# Patient Record
Sex: Female | Born: 1990 | Race: White | Hispanic: No | Marital: Married | Smoking: Current some day smoker
Health system: Southern US, Community
[De-identification: ages and names within clinical notes are randomized; demographics above are authoritative.]

## PROBLEM LIST (undated history)

## (undated) ENCOUNTER — Inpatient Hospital Stay (HOSPITAL_COMMUNITY): Payer: Self-pay

## (undated) DIAGNOSIS — H409 Unspecified glaucoma: Secondary | ICD-10-CM

## (undated) DIAGNOSIS — D649 Anemia, unspecified: Secondary | ICD-10-CM

## (undated) DIAGNOSIS — M1990 Unspecified osteoarthritis, unspecified site: Secondary | ICD-10-CM

## (undated) DIAGNOSIS — G43909 Migraine, unspecified, not intractable, without status migrainosus: Secondary | ICD-10-CM

## (undated) DIAGNOSIS — F99 Mental disorder, not otherwise specified: Secondary | ICD-10-CM

## (undated) DIAGNOSIS — Z3490 Encounter for supervision of normal pregnancy, unspecified, unspecified trimester: Secondary | ICD-10-CM

## (undated) HISTORY — PX: OTHER SURGICAL HISTORY: SHX169

## (undated) HISTORY — PX: WISDOM TOOTH EXTRACTION: SHX21

## (undated) HISTORY — PX: DILATION AND CURETTAGE OF UTERUS: SHX78

---
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
## 2008-04-02 ENCOUNTER — Other Ambulatory Visit: Admission: RE | Admit: 2008-04-02 | Discharge: 2008-04-02 | Payer: Self-pay | Admitting: Obstetrics and Gynecology

## 2008-09-05 ENCOUNTER — Ambulatory Visit: Payer: Self-pay | Admitting: Obstetrics and Gynecology

## 2008-09-05 ENCOUNTER — Inpatient Hospital Stay (HOSPITAL_COMMUNITY): Admission: AD | Admit: 2008-09-05 | Discharge: 2008-09-08 | Payer: Self-pay | Admitting: Obstetrics and Gynecology

## 2008-09-08 ENCOUNTER — Ambulatory Visit: Payer: Self-pay | Admitting: Advanced Practice Midwife

## 2008-09-08 ENCOUNTER — Inpatient Hospital Stay (HOSPITAL_COMMUNITY): Admission: AD | Admit: 2008-09-08 | Discharge: 2008-09-08 | Payer: Self-pay | Admitting: Obstetrics & Gynecology

## 2009-06-19 ENCOUNTER — Emergency Department (HOSPITAL_COMMUNITY): Admission: EM | Admit: 2009-06-19 | Discharge: 2009-06-20 | Payer: Self-pay | Admitting: Emergency Medicine

## 2009-12-31 ENCOUNTER — Emergency Department (HOSPITAL_COMMUNITY): Admission: EM | Admit: 2009-12-31 | Discharge: 2010-01-01 | Payer: Self-pay | Admitting: Emergency Medicine

## 2010-07-20 NOTE — L&D Delivery Note (Signed)
Delivery Note At 8:19 PM a viable female was delivered via Vaginal, Spontaneous Delivery (Presentation: ; Occiput Anterior).  APGAR: 8, 9; weight 10 lb 3.7 oz (4641 g).   Placenta status: Intact, Spontaneous.  Cord: 3 vessels with the following complications: None.    Anesthesia: Epidural  Episiotomy: None Lacerations: None Est. Blood Loss (mL): 300 mL  Mom to postpartum.  Baby to nursery-stable.  [name] [name] 07/19/2011, 8:42 PM

## 2010-09-10 ENCOUNTER — Other Ambulatory Visit: Payer: Self-pay | Admitting: Obstetrics and Gynecology

## 2010-09-10 DIAGNOSIS — O020 Blighted ovum and nonhydatidiform mole: Secondary | ICD-10-CM

## 2010-09-11 ENCOUNTER — Ambulatory Visit (HOSPITAL_COMMUNITY)
Admission: RE | Admit: 2010-09-11 | Discharge: 2010-09-11 | Disposition: A | Discharge status: Home / Self Care | Payer: Medicaid Other | Source: Ambulatory Visit | Attending: Obstetrics and Gynecology | Admitting: Obstetrics and Gynecology

## 2010-09-11 ENCOUNTER — Encounter (HOSPITAL_COMMUNITY): Payer: Self-pay

## 2010-09-11 DIAGNOSIS — O020 Blighted ovum and nonhydatidiform mole: Secondary | ICD-10-CM

## 2010-09-11 DIAGNOSIS — O43899 Other placental disorders, unspecified trimester: Secondary | ICD-10-CM | POA: Insufficient documentation

## 2010-09-11 DIAGNOSIS — O368390 Maternal care for abnormalities of the fetal heart rate or rhythm, unspecified trimester, not applicable or unspecified: Secondary | ICD-10-CM | POA: Insufficient documentation

## 2010-09-11 DIAGNOSIS — Z3689 Encounter for other specified antenatal screening: Secondary | ICD-10-CM | POA: Insufficient documentation

## 2010-09-16 ENCOUNTER — Other Ambulatory Visit: Payer: Self-pay | Admitting: Obstetrics and Gynecology

## 2010-09-16 ENCOUNTER — Encounter (HOSPITAL_COMMUNITY): Payer: Medicaid Other

## 2010-09-16 DIAGNOSIS — Z01812 Encounter for preprocedural laboratory examination: Secondary | ICD-10-CM | POA: Insufficient documentation

## 2010-09-16 LAB — CBC
HCT: 37.3 % (ref 36.0–46.0)
MCHC: 33.8 g/dL (ref 30.0–36.0)
MCV: 82.7 fL (ref 78.0–100.0)
RDW: 13.4 % (ref 11.5–15.5)

## 2010-09-16 LAB — SURGICAL PCR SCREEN: Staphylococcus aureus: NEGATIVE

## 2010-09-17 ENCOUNTER — Other Ambulatory Visit: Payer: Self-pay | Admitting: Obstetrics and Gynecology

## 2010-09-17 ENCOUNTER — Ambulatory Visit (HOSPITAL_COMMUNITY)
Admission: RE | Admit: 2010-09-17 | Discharge: 2010-09-18 | Disposition: A | Discharge status: Home / Self Care | Payer: Medicaid Other | Source: Ambulatory Visit | Attending: Obstetrics and Gynecology | Admitting: Obstetrics and Gynecology

## 2010-09-17 DIAGNOSIS — Z01812 Encounter for preprocedural laboratory examination: Secondary | ICD-10-CM | POA: Insufficient documentation

## 2010-09-17 DIAGNOSIS — O021 Missed abortion: Secondary | ICD-10-CM | POA: Insufficient documentation

## 2010-09-18 NOTE — Op Note (Signed)
  NAME:  [name] [name], GLASNER NO.:  [id number]  MEDICAL RECORD NO.:  [id number]           PATIENT TYPE:  O  LOCATION:  DAYP                          FACILITY:  [facility]  PHYSICIAN:  Tilda Burrow, M.D. DATE OF BIRTH:  [birth year]  DATE OF PROCEDURE:  09/17/2010 DATE OF DISCHARGE:                              OPERATIVE REPORT   PREOPERATIVE DIAGNOSIS:  Missed abortion, suspected molar pregnancy. POSTOPERATIVE DIAGNOSIS:  Missed abortion, suspected molar pregnancy.  PROCEDURE:  Suction, dilation and curettage.  SURGEON:  Tilda Burrow, MD ASSISTANT:  Amie Critchley CST  ANESTHESIA:  General with LMA. COMPLICATIONS:  None.  FINDINGS:  Uterus sounding 15 cm preprocedure, 12 cm postprocedure. Blood type O positive.  Generous blood clots tissue consistent with missed abortion. ESTIMATED BLOOD LOSS:  150 mL.  INDICATIONS:  A 20 year old female with ultrasound, diagnoses of missed abortion with hyperechoic very large placenta without the characteristic clump of grapes appearance of molar pregnancy, but definitely atypical in this presentation.  DETAILS OF PROCEDURE:  The patient was taken to the operating room, prepped and draped for vaginal procedure.  General anesthesia introduced, antibiotics administered, and IV oxytocin initiated.  The patient had prepping of the vagina.  Cervix grasped with single-tooth tenaculum and sounded to 15 cm in the anteflexed position.  The bladder had been in-and-out catheterized.  Cervix dilated to 93 [place] allowing introduction of a curved 10-mm suction curette which was then used under 50-60 mmHg pressure to extract generous amount of tissue and clots.  EBL was 150 mL.  Smooth sharp curettage was performed to confirm smooth contours in all 4 quadrants.  The uterus sounded to 12 cm postprocedure and blood loss at the end of the procedure was minimal.  The patient will receive IV Toradol in recovery and received prescriptions for  hydrocodone and Motrin and be followed up in 2 weeks at [facility] for serial HCGs.  Note, we will follow HCGs for up to 3 months to confirm benign followup.     Tilda Burrow, M.D.     JVF/MEDQ  D:  09/17/2010  T:  09/17/2010  Job:  [id number]  Electronically Signed by Christin Bach M.D. on 09/18/2010 08:48:29 PM

## 2010-11-04 LAB — CBC
HCT: 26.4 % — ABNORMAL LOW (ref 36.0–46.0)
Hemoglobin: 10.3 g/dL — ABNORMAL LOW (ref 12.0–15.0)
MCHC: 32.5 g/dL (ref 30.0–36.0)
MCHC: 32.6 g/dL (ref 30.0–36.0)
MCV: 76.5 fL — ABNORMAL LOW (ref 78.0–100.0)
MCV: 76.8 fL — ABNORMAL LOW (ref 78.0–100.0)
Platelets: 294 10*3/uL (ref 150–400)
RBC: 4.12 MIL/uL (ref 3.87–5.11)
RDW: 15.7 % — ABNORMAL HIGH (ref 11.5–15.5)

## 2010-11-07 LAB — RPR: RPR: NONREACTIVE

## 2010-11-24 ENCOUNTER — Other Ambulatory Visit: Payer: Self-pay | Admitting: Family Medicine

## 2010-11-24 ENCOUNTER — Other Ambulatory Visit (HOSPITAL_COMMUNITY)
Admission: RE | Admit: 2010-11-24 | Discharge: 2010-11-24 | Disposition: A | Discharge status: Home / Self Care | Payer: Medicaid Other | Source: Ambulatory Visit | Attending: Obstetrics and Gynecology | Admitting: Obstetrics and Gynecology

## 2010-11-24 DIAGNOSIS — Z01419 Encounter for gynecological examination (general) (routine) without abnormal findings: Secondary | ICD-10-CM | POA: Insufficient documentation

## 2010-11-24 DIAGNOSIS — Z113 Encounter for screening for infections with a predominantly sexual mode of transmission: Secondary | ICD-10-CM | POA: Insufficient documentation

## 2010-12-02 NOTE — Discharge Summary (Signed)
NAME:  [name] [name], [name] [name] NO.:  [id number]   MEDICAL RECORD NO.:  [id number]          PATIENT TYPE:  INP   LOCATION:  9147                          FACILITY:  [facility]   PHYSICIAN:  Tilda Burrow, M.D. DATE OF BIRTH:  [birth year]   DATE OF ADMISSION:  09/05/2008  DATE OF DISCHARGE:  09/08/2008                               DISCHARGE SUMMARY   ADMISSION DIAGNOSIS:  Induction of labor due to postdate status.   DISCHARGE DIAGNOSES:  Pregnancy 41 weeks, delivered, transient tachypnea  of newborn, possible amnionitis.   HISTORY OF PRESENT ILLNESS:  This is a 20 year old gravida 1, para 0-0-0-  0 at 41 weeks 2 days, was admitted on September 05, 2008, for induction  of labor after being seen in [facility] on September 05, 2008,  and Foley bulb placed in the office followed by bradycardia that  recovered.  Foley was discontinued.  The patient sent to [facility]  for reinsertion of Foley and induction of labor as indicated due to  advanced gestational age.  Blood type is O positive, antibody screen  negative, hemoglobin 12, hematocrit _____, RPR nonreactive, HIV  negative, GC negative, Chlamydia negative, group B strep positive on  August 10, 2008, and 1-hour glucose tolerance test 106 mg percent.  She was admitted, 1 cm dilated, 50% in the afternoon at 6 p.m. on  September 05, 2008, for induction of labor.  Pregnancy course consisted  of Foley bulb being reinserted without difficulty, tolerated well both  mother and fetus once reevaluation every 3-4 hours.  Cervix progressed  to 5 cm at 3:30 a.m. on September 05, 2008.  She received IV antibiotics  due to group B strep positive status.  She was 6 cm at 9:55 a.m., with  slowing of labor.  She reached completely dilated at 12 p.m.  At 1:45  p.m., she has spontaneous vertex vaginal delivery with some shoulder  dystocia.  The right shoulder was moved to oblique by the delivering  midwife Deirdre Poe, with Donnie Coffin maneuver  followed and suprapubic  pressure resulting in release of the anterior shoulder within  approximately 2 minutes.  At one minute Apgar was 7 with five minute  Apgar of 8.  Blow-by oxygen administered and placenta was sent to  Pathology.  The infant experienced some transient tachypnea and was treated to rule  out infection by transfer to the nursery.  Maternal postpartum care was  uneventful.  The patient had significant amount of fluid retention, but  was otherwise on routine postpartum care.  Her postpartum hemoglobin  8.6, hematocrit 26.4 compared to 10.3 and 31.7 on admission.  RPR was  repeated and nonreactive.  The patient was stable for discharge on  September 08, 2008.  The baby is still in the nursery on room air and  will receive an additional 3 days of IV antibiotics as precautions.  The  patient plans to breast-feed, but milk was not coming at this point.  We  will begin oral contraceptives with progesterone only pill and follow up  in 4 weeks for consideration of Implanon.  Tilda Burrow, M.D.  Electronically Signed    JVF/MEDQ  D:  09/08/2008  T:  09/08/2008  Job:  [id number]   cc:   [facility]   [facility]

## 2011-01-27 ENCOUNTER — Encounter (HOSPITAL_COMMUNITY): Payer: Self-pay | Admitting: *Deleted

## 2011-01-27 ENCOUNTER — Emergency Department (HOSPITAL_COMMUNITY)
Admission: EM | Admit: 2011-01-27 | Discharge: 2011-01-27 | Disposition: A | Discharge status: Home / Self Care | Payer: Medicaid Other | Attending: Emergency Medicine | Admitting: Emergency Medicine

## 2011-01-27 ENCOUNTER — Ambulatory Visit (HOSPITAL_COMMUNITY): Payer: Medicaid Other | Attending: Emergency Medicine

## 2011-01-27 DIAGNOSIS — M25579 Pain in unspecified ankle and joints of unspecified foot: Secondary | ICD-10-CM | POA: Insufficient documentation

## 2011-01-27 DIAGNOSIS — J45909 Unspecified asthma, uncomplicated: Secondary | ICD-10-CM | POA: Insufficient documentation

## 2011-01-27 DIAGNOSIS — M25473 Effusion, unspecified ankle: Secondary | ICD-10-CM | POA: Insufficient documentation

## 2011-01-27 DIAGNOSIS — M25476 Effusion, unspecified foot: Secondary | ICD-10-CM | POA: Insufficient documentation

## 2011-01-27 DIAGNOSIS — R296 Repeated falls: Secondary | ICD-10-CM | POA: Insufficient documentation

## 2011-01-27 DIAGNOSIS — S93409A Sprain of unspecified ligament of unspecified ankle, initial encounter: Secondary | ICD-10-CM | POA: Insufficient documentation

## 2011-01-27 DIAGNOSIS — Y92009 Unspecified place in unspecified non-institutional (private) residence as the place of occurrence of the external cause: Secondary | ICD-10-CM | POA: Insufficient documentation

## 2011-01-27 NOTE — ED Provider Notes (Addendum)
History     Chief Complaint  Patient presents with  . Fall    left ankle pain   Patient is a 20 y.o. female presenting with ankle pain. The history is provided by the patient.  Ankle Pain  The incident occurred yesterday. The incident occurred at home. The injury mechanism was a fall (twisting movement). The pain is present in the left ankle. The quality of the pain is described as throbbing and aching. The pain is moderate. The pain has been constant since onset. Pertinent negatives include no numbness, no inability to bear weight, no loss of motion and no tingling. She reports no foreign bodies present. The symptoms are aggravated by activity, bearing weight and palpation. She has tried nothing for the symptoms. The treatment provided no relief.    Past Medical History  Diagnosis Date  . Asthma     Past Surgical History  Procedure Date  . Dilation and curettage of uterus     History reviewed. No pertinent family history.  History  Substance Use Topics  . Smoking status: Current Everyday Smoker    Types: Cigarettes  . Smokeless tobacco: Not on file  . Alcohol Use: No    OB History    Grav Para Term Preterm Abortions TAB SAB Ect Mult Living   1               Review of Systems  Constitutional: Negative.   HENT: Negative.   Respiratory: Negative.   Cardiovascular: Negative.   Musculoskeletal: Positive for joint swelling. Negative for back pain and gait problem.  Skin: Negative.   Neurological: Negative for tingling, weakness, numbness and headaches.  Hematological: Does not bruise/bleed easily.    Physical Exam  BP 116/57  Pulse 85  Temp(Src) 98.3 F (36.8 C) (Oral)  Resp 20  Ht 5' (1.524 m)  Wt 162 lb (73.483 kg)  BMI 31.64 kg/m2  SpO2 100%  LMP 09/18/2010  Physical Exam  Constitutional: She is oriented to person, place, and time. She appears well-developed and well-nourished.  HENT:  Head: Normocephalic.  Eyes: Pupils are equal, round, and reactive to  light.  Neck: Normal range of motion. Neck supple.  Cardiovascular: Normal rate, regular rhythm and normal heart sounds.   Pulmonary/Chest: Effort normal and breath sounds normal.  Abdominal: Soft.  Musculoskeletal: She exhibits tenderness.       ttp of the lateral left ankle.  Mild STS , no bruising or erythema.  DP pulse intact.  Distal sensation also intact  Neurological: She is alert and oriented to person, place, and time.  Skin: Skin is dry.  Psychiatric: She has a normal mood and affect.    ED Course  Procedures  MDM   XRay of left ankle was reviewed by me.  No acute fx.  ASo applied to the left ankle. Moves all extremites w/o difficulty.   Mild STS, DP pulse intact.  Distal sensation also intact.        Tammy L. Trisha Mangle, [place] 01/31/11 2219  Medical screening examination/treatment/procedure(s) were performed by non-physician practitioner and as supervising physician I was immediately available for consultation/collaboration.   Benny Lennert, MD 03/06/11 [phone]

## 2011-01-27 NOTE — ED Notes (Signed)
Pt also complains of pain at left shoulder and left under arm area, pain started this am, pain is worse with movement, pt has abrasion to both knees, no bleeding noted,

## 2011-01-27 NOTE — ED Notes (Signed)
C/o left ankle pain s/p fall yesterday; states foot slipped through an opening on porch step causing her to fall

## 2011-01-27 NOTE — ED Notes (Signed)
Pt states that she fell on the porch yesterday, there was a dip in the floor, pt twisted left foot ankle, continued to have pain this am, cms intact distal , swelling noted to left ankle,

## 2011-07-13 LAB — GC/CHLAMYDIA PROBE AMP, GENITAL: Chlamydia: NEGATIVE

## 2011-07-19 ENCOUNTER — Encounter (HOSPITAL_COMMUNITY): Payer: Self-pay

## 2011-07-19 ENCOUNTER — Inpatient Hospital Stay (HOSPITAL_COMMUNITY): Payer: Medicaid Other | Admitting: Anesthesiology

## 2011-07-19 ENCOUNTER — Inpatient Hospital Stay (HOSPITAL_COMMUNITY)
Admission: RE | Admit: 2011-07-19 | Discharge: 2011-07-21 | DRG: 775 | Disposition: A | Discharge status: Home / Self Care | Payer: Medicaid Other | Source: Ambulatory Visit | Attending: Obstetrics & Gynecology | Admitting: Obstetrics & Gynecology

## 2011-07-19 ENCOUNTER — Encounter (HOSPITAL_COMMUNITY): Payer: Self-pay | Admitting: Anesthesiology

## 2011-07-19 DIAGNOSIS — O3660X0 Maternal care for excessive fetal growth, unspecified trimester, not applicable or unspecified: Secondary | ICD-10-CM | POA: Diagnosis present

## 2011-07-19 DIAGNOSIS — G43909 Migraine, unspecified, not intractable, without status migrainosus: Secondary | ICD-10-CM | POA: Insufficient documentation

## 2011-07-19 DIAGNOSIS — O480 Post-term pregnancy: Secondary | ICD-10-CM

## 2011-07-19 DIAGNOSIS — F99 Mental disorder, not otherwise specified: Secondary | ICD-10-CM | POA: Insufficient documentation

## 2011-07-19 HISTORY — DX: Migraine, unspecified, not intractable, without status migrainosus: G43.909

## 2011-07-19 HISTORY — DX: Mental disorder, not otherwise specified: F99

## 2011-07-19 LAB — CBC
MCH: 23.2 pg — ABNORMAL LOW (ref 26.0–34.0)
MCV: 74.3 fL — ABNORMAL LOW (ref 78.0–100.0)
Platelets: 323 10*3/uL (ref 150–400)
RDW: 15.6 % — ABNORMAL HIGH (ref 11.5–15.5)
WBC: 17.8 10*3/uL — ABNORMAL HIGH (ref 4.0–10.5)

## 2011-07-19 LAB — STREP B DNA PROBE: GBS: NEGATIVE

## 2011-07-19 LAB — HEPATITIS B SURFACE ANTIGEN: Hepatitis B Surface Ag: NEGATIVE

## 2011-07-19 LAB — ABO/RH

## 2011-07-19 LAB — RPR: RPR: NONREACTIVE

## 2011-07-19 MED ORDER — TERBUTALINE SULFATE 1 MG/ML IJ SOLN: 0.2500 mg | Freq: Once | INTRAMUSCULAR | Status: DC | PRN

## 2011-07-19 MED ORDER — LANOLIN HYDROUS EX OINT: TOPICAL_OINTMENT | CUTANEOUS | Status: DC | PRN

## 2011-07-19 MED ORDER — LIDOCAINE HCL (PF) 1 % IJ SOLN
30.0000 mL | INTRAMUSCULAR | Status: DC | PRN
  Filled 2011-07-19: qty 30

## 2011-07-19 MED ORDER — ZOLPIDEM TARTRATE 5 MG PO TABS: 5.0000 mg | ORAL_TABLET | Freq: Every evening | ORAL | Status: DC | PRN

## 2011-07-19 MED ORDER — OXYTOCIN 20 UNITS IN LACTATED RINGERS INFUSION - SIMPLE
125.0000 mL/h | Freq: Once | INTRAVENOUS | Status: AC
  Administered 2011-07-19: 125 mL/h via INTRAVENOUS

## 2011-07-19 MED ORDER — SENNOSIDES-DOCUSATE SODIUM 8.6-50 MG PO TABS
2.0000 | ORAL_TABLET | Freq: Every day | ORAL | Status: DC
  Administered 2011-07-20: 2 via ORAL

## 2011-07-19 MED ORDER — OXYTOCIN 20 UNITS IN LACTATED RINGERS INFUSION - SIMPLE
1.0000 m[IU]/min | INTRAVENOUS | Status: DC
  Administered 2011-07-19: 2 m[IU]/min via INTRAVENOUS

## 2011-07-19 MED ORDER — LACTATED RINGERS IV SOLN
INTRAVENOUS | Status: DC
  Administered 2011-07-19: 125 mL/h via INTRAVENOUS
  Administered 2011-07-19: 09:00:00 via INTRAVENOUS

## 2011-07-19 MED ORDER — DIBUCAINE 1 % RE OINT
1.0000 "application " | TOPICAL_OINTMENT | RECTAL | Status: DC | PRN
  Administered 2011-07-21: 1 via RECTAL
  Filled 2011-07-19: qty 28

## 2011-07-19 MED ORDER — HYDROCODONE-ACETAMINOPHEN 5-325 MG PO TABS
1.0000 | ORAL_TABLET | Freq: Four times a day (QID) | ORAL | Status: DC | PRN
  Administered 2011-07-20 – 2011-07-21 (×4): 1 via ORAL
  Filled 2011-07-19 (×4): qty 1

## 2011-07-19 MED ORDER — DIPHENHYDRAMINE HCL 50 MG/ML IJ SOLN: 12.5000 mg | INTRAMUSCULAR | Status: DC | PRN

## 2011-07-19 MED ORDER — IBUPROFEN 600 MG PO TABS
600.0000 mg | ORAL_TABLET | Freq: Four times a day (QID) | ORAL | Status: DC
  Administered 2011-07-20 (×4): 600 mg via ORAL
  Filled 2011-07-19 (×4): qty 1

## 2011-07-19 MED ORDER — FENTANYL 2.5 MCG/ML BUPIVACAINE 1/10 % EPIDURAL INFUSION (WH - ANES)
14.0000 mL/h | INTRAMUSCULAR | Status: DC
  Administered 2011-07-19 (×2): 14 mL/h via EPIDURAL
  Filled 2011-07-19 (×2): qty 60

## 2011-07-19 MED ORDER — ONDANSETRON HCL 4 MG/2ML IJ SOLN: 4.0000 mg | INTRAMUSCULAR | Status: DC | PRN

## 2011-07-19 MED ORDER — LACTATED RINGERS IV SOLN: 500.0000 mL | Freq: Once | INTRAVENOUS | Status: DC

## 2011-07-19 MED ORDER — ACETAMINOPHEN 325 MG PO TABS: 650.0000 mg | ORAL_TABLET | ORAL | Status: DC | PRN

## 2011-07-19 MED ORDER — PHENYLEPHRINE 40 MCG/ML (10ML) SYRINGE FOR IV PUSH (FOR BLOOD PRESSURE SUPPORT)
80.0000 ug | PREFILLED_SYRINGE | INTRAVENOUS | Status: DC | PRN
  Filled 2011-07-19: qty 5

## 2011-07-19 MED ORDER — FLEET ENEMA 7-19 GM/118ML RE ENEM: 1.0000 | ENEMA | RECTAL | Status: DC | PRN

## 2011-07-19 MED ORDER — WITCH HAZEL-GLYCERIN EX PADS
1.0000 "application " | MEDICATED_PAD | CUTANEOUS | Status: DC | PRN
  Administered 2011-07-21: 1 via TOPICAL

## 2011-07-19 MED ORDER — LIDOCAINE HCL 1.5 % IJ SOLN
INTRAMUSCULAR | Status: DC | PRN
  Administered 2011-07-19 (×2): 5 mL via EPIDURAL

## 2011-07-19 MED ORDER — TETANUS-DIPHTH-ACELL PERTUSSIS 5-2.5-18.5 LF-MCG/0.5 IM SUSP: 0.5000 mL | Freq: Once | INTRAMUSCULAR | Status: DC

## 2011-07-19 MED ORDER — OXYTOCIN BOLUS FROM INFUSION
500.0000 mL | Freq: Once | INTRAVENOUS | Status: DC
  Filled 2011-07-19: qty 1000
  Filled 2011-07-19: qty 500

## 2011-07-19 MED ORDER — OXYCODONE-ACETAMINOPHEN 5-325 MG PO TABS: 2.0000 | ORAL_TABLET | ORAL | Status: DC | PRN

## 2011-07-19 MED ORDER — BENZOCAINE-MENTHOL 20-0.5 % EX AERO: 1.0000 "application " | INHALATION_SPRAY | CUTANEOUS | Status: DC | PRN

## 2011-07-19 MED ORDER — CITRIC ACID-SODIUM CITRATE 334-500 MG/5ML PO SOLN: 30.0000 mL | ORAL | Status: DC | PRN

## 2011-07-19 MED ORDER — EPHEDRINE 5 MG/ML INJ
10.0000 mg | INTRAVENOUS | Status: DC | PRN
  Filled 2011-07-19 (×2): qty 4

## 2011-07-19 MED ORDER — DIPHENHYDRAMINE HCL 25 MG PO CAPS: 25.0000 mg | ORAL_CAPSULE | Freq: Four times a day (QID) | ORAL | Status: DC | PRN

## 2011-07-19 MED ORDER — ONDANSETRON HCL 4 MG PO TABS: 4.0000 mg | ORAL_TABLET | ORAL | Status: DC | PRN

## 2011-07-19 MED ORDER — PRENATAL MULTIVITAMIN CH
1.0000 | ORAL_TABLET | Freq: Every day | ORAL | Status: DC
  Administered 2011-07-20: 1 via ORAL
  Filled 2011-07-19: qty 1

## 2011-07-19 MED ORDER — ONDANSETRON HCL 4 MG/2ML IJ SOLN: 4.0000 mg | Freq: Four times a day (QID) | INTRAMUSCULAR | Status: DC | PRN

## 2011-07-19 MED ORDER — PHENYLEPHRINE 40 MCG/ML (10ML) SYRINGE FOR IV PUSH (FOR BLOOD PRESSURE SUPPORT)
80.0000 ug | PREFILLED_SYRINGE | INTRAVENOUS | Status: DC | PRN
  Filled 2011-07-19 (×2): qty 5

## 2011-07-19 MED ORDER — SIMETHICONE 80 MG PO CHEW: 80.0000 mg | CHEWABLE_TABLET | ORAL | Status: DC | PRN

## 2011-07-19 MED ORDER — NALBUPHINE SYRINGE 5 MG/0.5 ML: 5.0000 mg | INJECTION | INTRAMUSCULAR | Status: DC | PRN

## 2011-07-19 MED ORDER — IBUPROFEN 600 MG PO TABS: 600.0000 mg | ORAL_TABLET | Freq: Four times a day (QID) | ORAL | Status: DC | PRN

## 2011-07-19 MED ORDER — LACTATED RINGERS IV SOLN
500.0000 mL | INTRAVENOUS | Status: DC | PRN
Start: 2011-07-19 — End: 2011-07-19
  Administered 2011-07-19: 500 mL via INTRAVENOUS

## 2011-07-19 MED ORDER — EPHEDRINE 5 MG/ML INJ
10.0000 mg | INTRAVENOUS | Status: DC | PRN
  Filled 2011-07-19: qty 4

## 2011-07-19 NOTE — Treatment Plan (Signed)
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

## 2011-07-19 NOTE — Progress Notes (Signed)
  Subjective: Feeling pressure.  Objective: BP 129/83  Pulse 106  Temp(Src) 99.1 F (37.3 C) (Oral)  Resp 20  Ht 5\' 1"  (1.549 m)  Wt 90.719 kg (200 lb)  BMI 37.79 kg/m2  SpO2 100%  LMP 09/18/2010      FHT:  FHR: 120s bpm, variability: moderate,  accelerations:  Present,  decelerations:  Absent UC:   regular, every 2-3 minutes SVE:   Dilation: Lip/rim Effacement (%): 100 Station: +1;0 Exam by:: Dr Adrian Blackwater  Labs: Lab Results  Component Value Date   WBC 17.8* 07/19/2011   HGB 10.4* 07/19/2011   HCT 33.3* 07/19/2011   MCV 74.3* 07/19/2011   PLT 323 07/19/2011    Assessment / Plan: Continue expectant management.  Category 1 tracing.    [name] [name] JEHIEL 07/19/2011, 6:32 PM

## 2011-07-19 NOTE — Progress Notes (Signed)
When preparing to increase pitocin per protocol, noticed line was not connected.  Restarted @ 82mu/min & notified Dr Adrian Blackwater.

## 2011-07-19 NOTE — Anesthesia Preprocedure Evaluation (Signed)
Anesthesia Evaluation  Patient identified by MRN, date of birth, ID band Patient awake    Reviewed: Allergy & Precautions, H&P , Patient's Chart, lab work & pertinent test results  Airway Mallampati: II TM Distance: >3 FB Neck ROM: full    Dental No notable dental hx.    Pulmonary neg pulmonary ROS, asthma ,  clear to auscultation  Pulmonary exam normal       Cardiovascular neg cardio ROS regular Normal    Neuro/Psych  Headaches, Negative Neurological ROS  Negative Psych ROS   GI/Hepatic negative GI ROS, Neg liver ROS,   Endo/Other  Negative Endocrine ROSHyperthyroidism   Renal/GU negative Renal ROS     Musculoskeletal   Abdominal   Peds  Hematology negative hematology ROS (+)   Anesthesia Other Findings   Reproductive/Obstetrics (+) Pregnancy                           Anesthesia Physical Anesthesia Plan  ASA: III  Anesthesia Plan: Epidural   Post-op Pain Management:    Induction:   Airway Management Planned:   Additional Equipment:   Intra-op Plan:   Post-operative Plan:   Informed Consent: I have reviewed the patients History and Physical, chart, labs and discussed the procedure including the risks, benefits and alternatives for the proposed anesthesia with the patient or authorized representative who has indicated his/her understanding and acceptance.     Plan Discussed with:   Anesthesia Plan Comments:         Anesthesia Quick Evaluation

## 2011-07-19 NOTE — Progress Notes (Signed)
Anesthesia, OR staff, NICU, & House Coverage all updated on pt's hx of Shoulder Dystocia & macrosomia.

## 2011-07-19 NOTE — Anesthesia Procedure Notes (Signed)
Epidural Patient location during procedure: OB Start time: 07/19/2011 3:09 PM  Staffing Anesthesiologist: Brayton Caves R Performed by: anesthesiologist   Preanesthetic Checklist Completed: patient identified, site marked, surgical consent, pre-op evaluation, timeout performed, IV checked, risks and benefits discussed and monitors and equipment checked  Epidural Patient position: sitting Prep: site prepped and draped and DuraPrep Patient monitoring: continuous pulse ox and blood pressure Approach: midline Injection technique: LOR air and LOR saline  Needle:  Needle type: Tuohy  Needle gauge: 17 G Needle length: 9 cm Needle insertion depth: 6 cm Catheter type: closed end flexible Catheter size: 19 Gauge Catheter at skin depth: 11 cm Test dose: negative  Assessment Events: blood not aspirated, injection not painful, no injection resistance, negative IV test and no paresthesia  Additional Notes Patient identified.  Risk benefits discussed including failed block, incomplete pain control, headache, nerve damage, paralysis, blood pressure changes, nausea, vomiting, reactions to medication both toxic or allergic, and postpartum back pain.  Patient expressed understanding and wished to proceed.  All questions were answered.  Sterile technique used throughout procedure and epidural site dressed with sterile barrier dressing. No paresthesia or other complications noted.The patient did not experience any signs of intravascular injection such as tinnitus or metallic taste in mouth nor signs of intrathecal spread such as rapid motor block. Please see nursing notes for vital signs.

## 2011-07-19 NOTE — Progress Notes (Signed)
Reviewed EFM c Dr Adrian Blackwater.  Updated him on current Pitocin rate & last SVE.  Will cont Pitocin @ 94mu/min

## 2011-07-19 NOTE — H&P (Signed)
[name] [name] is a 20 y.o. female presenting for induction of labor for postdates and LGA.   History G3P1011 at 40 weeks and 6 days.  Feeling occasional contractions, good fetal activity.  Denies vaginal discharge, vaginal bleeding.  Had significant shoulder dystocia with previous delivery - 8 pound 14 oz. Estimated fetal weight with this baby greater than 9 pounds.  Patient counseled regarding risk of having shoulder dystocia with this delivery.  Patient declines cesarean section.  OB History    Grav Para Term Preterm Abortions TAB SAB Ect Mult Living   3 1 1  1  1   1      Past Medical History  Diagnosis Date  . Asthma    Past Surgical History  Procedure Date  . Dilation and curettage of uterus   . Wisdom tooth extraction   . Jaw wired shut   . Penny removed from esophagus    Family History: family history is not on file. Social History:  reports that she has quit smoking. Her smoking use included Cigarettes. She has never used smokeless tobacco. She reports that she does not drink alcohol or use illicit drugs.  Review of Systems  All other systems reviewed and are negative.     Blood pressure 104/61, pulse 101, temperature 98.3 F (36.8 C), temperature source Oral, resp. rate 20, height 5\' 1"  (1.549 m), weight 90.719 kg (200 lb), last menstrual period 09/18/2010.   Fetal Exam Fetal Monitor Review: Baseline rate: 140s.  Variability: moderate (6-25 bpm).   Pattern: accelerations present and no decelerations.    Fetal State Assessment: Category I - tracings are normal.     Physical Exam  Constitutional: She is oriented to person, place, and time. She appears well-developed and well-nourished.  HENT:  Head: Normocephalic.  Cardiovascular: Normal rate.   Respiratory: Effort normal and breath sounds normal.  GI: Soft. Bowel sounds are normal. She exhibits no distension and no mass. There is no tenderness. There is no rebound and no guarding.  Neurological: She is alert  and oriented to person, place, and time.  Skin: Skin is warm and dry.    Prenatal labs: ABO, Rh: --/--/O POS (02/28 1514) Antibody:  negative Rubella:  immune RPR:   nonreactive HBsAg:   negative HIV:   nonreactive GBS:   negative 2hour GTT: 70, 115, 90  Assessment/Plan: #1 G3P1011 at 40 weeks 6 days #2 LGA #3 Previous shoulder dystocia #4 GBS neg   Extensive counseling done.  Will proceede with induction of labor.  Favorable cervix - will start with pitocin.  Low threshold for c-section. Nikash Mortensen JEHIEL 07/19/2011, 8:51 AM

## 2011-07-19 NOTE — Progress Notes (Signed)
Subjective: Patient feeling some contractions  Objective: BP 109/82  Pulse 81  Temp(Src) 97.8 F (36.6 C) (Oral)  Resp 20  Ht 5\' 1"  (1.549 m)  Wt 90.719 kg (200 lb)  BMI 37.79 kg/m2  SpO2 100%  LMP 09/18/2010     FHT:  FHR: 130 bpm, variability: moderate,  accelerations:  Present,  decelerations:  Absent UC:   regular, every 2-3 minutes SVE:   Dilation: 8 Effacement (%): 80 Station: -1;0 Exam by:: Dr Adrian Blackwater  Labs: Lab Results  Component Value Date   WBC 17.8* 07/19/2011   HGB 10.4* 07/19/2011   HCT 33.3* 07/19/2011   MCV 74.3* 07/19/2011   PLT 323 07/19/2011    Assessment / Plan: IOL for postdates.  Continue pit.  AROM with clear fluid.  [name] [name] 07/19/2011, 4:29 PM

## 2011-07-20 NOTE — Progress Notes (Signed)
Post Partum Day 1 Subjective: no complaints, up ad lib, voiding and tolerating PO.  Pain well controlled  Objective: Blood pressure 95/58, pulse 72, temperature 98.5 F (36.9 C), temperature source Oral, resp. rate 18, height 5\' 1"  (1.549 m), weight 90.719 kg (200 lb), last menstrual period 09/18/2010, SpO2 100.00%, unknown if currently breastfeeding.  Physical Exam:  General: alert, cooperative and no distress Lochia: appropriate Uterine Fundus: firm DVT Evaluation: No evidence of DVT seen on physical exam.   Basename 07/19/11 0815  HGB 10.4*  HCT 33.3*    Assessment/Plan: Plan for discharge tomorrow, Breastfeeding and Lactation consult   LOS: 1 day   Larue Drawdy JEHIEL 07/20/2011, 8:32 AM

## 2011-07-20 NOTE — Progress Notes (Signed)
UR chart review completed.  

## 2011-07-20 NOTE — Progress Notes (Signed)
PSYCHOSOCIAL ASSESSMENT ~ MATERNAL/CHILD Name: [name] [name]                                                                                        Age: 20   Referral Date: 07/20/11   Reason/Source: THC use during pregnancy  I. FAMILY/HOME ENVIRONMENT A. Child's Legal Guardian _x__Parent(s) ___Grandparent ___Foster parent ___DSS_________________ Name: Jed Limerick                                      DOB: 1991-07-14           Age: 34  Address: [address] [place], [place] [address]  Name: Eldoris Beiser                                     DOB: //                     Age:   Address: same  B. Other Household Members/Support Persons Name: Lyrica Thackston                Relationship: sister              DOB [birth year] 2010                   Name:                                         Relationship:                        DOB ___/___/___                   Name:                                         Relationship:                        DOB ___/___/___                   Name:                                         Relationship:                        DOB ___/___/___  C. Other Support: Good support system   II. PSYCHOSOCIAL DATA A. Information Source  _x_Patient Interview  _x_Family Interview           _x_Other: [name]'s chart  B. Event organiser __Employment: _x_Medicaid    [place]: [employer]                __Private Insurance:                   __Self Pay  _x_Food Stamps   _x_WIC __[employer]     __Public Housing     __Section 8    __Maternity Care Coordination/Child Service Coordination/Early Intervention  __School:                                                                         Grade:  __Other:   [employer] Saner Cultural and Environment Information Cultural Issues Impacting Care: none known  III. STRENGTHS _x__Supportive family/friends _x__Adequate Resources _x__Compliance with medical  plan _x__Home prepared for Child (including basic supplies) ___Understanding of illness      _x__Other: Pediatrician is [facility] IV. RISK FACTORS AND CURRENT PROBLEMS         ____No Problems Noted                                                                                                                                                                                                                                                Pt              Family          Substance Abuse                                                                  _x__              ___             Mental Illness  ___              ___  Family/Relationship Issues                                      ___               ___             Abuse/Neglect/Domestic Violence                                         ___         ___  Financial Resources                                        ___              ___             Transportation                                                                        ___               ___  DSS Involvement                                                                   ___              ___  Adjustment to Illness                                                               ___              ___  Knowledge/Cognitive Deficit                                                   ___              ___             Compliance with Treatment                                                 ___                ___  Basic Needs (food, housing, etc.)                                          ___              ___             Housing Concerns                                       ___              ___ Other_____________________________________________________________            V. SOCIAL WORK ASSESSMENT SW met with parents in [name]'s first floor room to complete assessment.  Both parents were quiet, but very pleasant and seemed open  with SW.  They report having everything they need for baby at home except for bigger clothes since the newborn clothes that they have will not fit him due to his birth weight.  They also report having good supports available to help with the children when they go home.  They have a 34 year old daughter at home as well.  SW asked [name] about her THC use and she states that she was nauseous and Marijuana was the only thing that helped her feel better.  She states she stopped use in October when the sickness had ceased.  She states she was around it at friends' houses also, but she states she will not have the baby at those houses.  She states no plans for use at this time.  FOB denies use and states that he is allergic to smoke.  SW informed parents of hospital drug screen policy and mandated reporting to Child Protective Services if screens are positive.  They state understanding.  They report no questions or needs at this time.  VI. SOCIAL WORK PLAN  _x__No Further Intervention Required/No Barriers to Discharge   ___Psychosocial Support and Ongoing Assessment of Needs   ___Patient/Family Education:   ___Child Protective Services Report   County___________ Date___/____/____   ___Information/Referral to Community Resources_________________________   _x__Other: SW will monitor drug screens.

## 2011-07-20 NOTE — Progress Notes (Signed)
Post Partum Day 1 Subjective: Mild mid low back pain. Voiding qs without dysuria. No orthostatic sx. Baby having some low CBGs. Lochia like menses. Plans Nuva Ring, circ in office.   Objective: Blood pressure 95/58, pulse 72, temperature 98.5 F (36.9 C), temperature source Oral, resp. rate 18, height 5\' 1"  (1.549 m), weight 90.719 kg (200 lb), last menstrual period 09/18/2010, SpO2 100.00%, unknown if currently breastfeeding.  Physical Exam:  General: alert, cooperative, no distress and moderately obese Lochia: appropriate Uterine Fundus: firm Incision: N/A DVT Evaluation: No evidence of DVT seen on physical exam.   Basename 07/19/11 0815  HGB 10.4*  HCT 33.3*    Assessment/Plan: Plan for discharge tomorrow   LOS: 1 day   [name],[name] 07/20/2011, 9:29 AM

## 2011-07-20 NOTE — Anesthesia Postprocedure Evaluation (Signed)
  Anesthesia Post-op Note  Patient: [name] [name]  Procedure(s) Performed: * No procedures listed *  Patient Location: Mother/Baby  Anesthesia Type: Epidural  Level of Consciousness: awake, alert  and oriented  Airway and Oxygen Therapy: Patient Spontanous Breathing  Post-op Pain: none  Post-op Assessment: Post-op Vital signs reviewed, Patient's Cardiovascular Status Stable, No headache, No backache, No residual numbness and No residual motor weakness  Post-op Vital Signs: Reviewed and stable  Complications: No apparent anesthesia complications

## 2011-07-21 MED ORDER — IBUPROFEN 600 MG PO TABS: 600.0000 mg | ORAL_TABLET | Freq: Four times a day (QID) | ORAL | Status: AC

## 2011-07-21 MED ORDER — ETONOGESTREL-ETHINYL ESTRADIOL 0.12-0.015 MG/24HR VA RING: VAGINAL_RING | VAGINAL | Status: DC

## 2011-07-21 NOTE — Discharge Summary (Signed)
Obstetric Discharge Summary Reason for Admission: induction of labor Prenatal Procedures: ultrasound Intrapartum Procedures: spontaneous vaginal delivery Postpartum Procedures: none Complications-Operative and Postpartum: none Hemoglobin  Date Value Range Status  07/19/2011 10.4* 12.0-15.0 (g/dL) Final     HCT  Date Value Range Status  07/19/2011 33.3* 36.0-46.0 (%) Final    Discharge Diagnoses: Term Pregnancy-delivered  Discharge Information: Date: 07/21/2011 Activity: pelvic rest Diet: routine Medications: Ibuprofen, Nuvaring (start 4 weeks PP, cautioned about decreased milk supply) Condition: stable Instructions: refer to practice specific booklet Discharge to: home Follow-up Information    Follow up with [facility] in 6 weeks. (or [facility] as needed)    Contact information:   [address] [place] [address]          Newborn Data: Live born female  Birth Weight: 10 lb 3.7 oz (4641 g) APGAR: 8, 9  Home with mother.  [name] [name] 07/21/2011, 7:08 AM

## 2011-07-21 NOTE — Discharge Summary (Signed)
Attestation of Attending Supervision of Advanced Practitioner: Evaluation and management procedures were performed by the PA/NP/CNM/OB Fellow under my supervision/collaboration. Chart reviewed, and agree with management and plan.  Jaynie Collins, M.D. 07/21/2011 8:40 AM

## 2011-07-21 NOTE — Progress Notes (Signed)
Post Partum Day 2 Subjective: no complaints, up ad lib, voiding, tolerating PO and + flatus  Objective: Blood pressure 101/67, pulse 73, temperature 98.3 F (36.8 C), temperature source Oral, resp. rate 18, height 5\' 1"  (1.549 m), weight 90.719 kg (200 lb), last menstrual period 09/18/2010, SpO2 97.00%, unknown if currently breastfeeding.  Physical Exam:  General: alert, cooperative and no distress Lochia: appropriate Uterine Fundus: firm Incision: NA DVT Evaluation: Negative Homan's sign.   Basename 07/19/11 0815  HGB 10.4*  HCT 33.3*    Assessment/Plan: Discharge home, Breastfeeding and Contraception Nuvaring. Coutionsed about possible decreased milk supply. Circ in office   LOS: 2 days   Dorathy Kinsman 07/21/2011, 7:02 AM

## 2011-12-19 ENCOUNTER — Encounter (HOSPITAL_COMMUNITY): Payer: Self-pay | Admitting: Emergency Medicine

## 2011-12-19 ENCOUNTER — Emergency Department (HOSPITAL_COMMUNITY): Payer: Medicaid Other

## 2011-12-19 ENCOUNTER — Emergency Department (HOSPITAL_COMMUNITY)
Admission: EM | Admit: 2011-12-19 | Discharge: 2011-12-19 | Disposition: A | Discharge status: Home / Self Care | Payer: Medicaid Other | Attending: Emergency Medicine | Admitting: Emergency Medicine

## 2011-12-19 DIAGNOSIS — F909 Attention-deficit hyperactivity disorder, unspecified type: Secondary | ICD-10-CM | POA: Insufficient documentation

## 2011-12-19 DIAGNOSIS — J45909 Unspecified asthma, uncomplicated: Secondary | ICD-10-CM | POA: Insufficient documentation

## 2011-12-19 DIAGNOSIS — M542 Cervicalgia: Secondary | ICD-10-CM | POA: Insufficient documentation

## 2011-12-19 DIAGNOSIS — M436 Torticollis: Secondary | ICD-10-CM | POA: Insufficient documentation

## 2011-12-19 MED ORDER — HYDROCODONE-ACETAMINOPHEN 5-325 MG PO TABS: 1.0000 | ORAL_TABLET | ORAL | Status: AC | PRN

## 2011-12-19 MED ORDER — HYDROMORPHONE HCL PF 1 MG/ML IJ SOLN
1.0000 mg | Freq: Once | INTRAMUSCULAR | Status: AC
  Administered 2011-12-19: 1 mg via INTRAMUSCULAR
  Filled 2011-12-19: qty 1

## 2011-12-19 MED ORDER — METHOCARBAMOL 500 MG PO TABS
1000.0000 mg | ORAL_TABLET | Freq: Once | ORAL | Status: AC
  Administered 2011-12-19: 1000 mg via ORAL
  Filled 2011-12-19: qty 2

## 2011-12-19 MED ORDER — METHOCARBAMOL 500 MG PO TABS: 1000.0000 mg | ORAL_TABLET | Freq: Four times a day (QID) | ORAL | Status: AC

## 2011-12-19 NOTE — ED Notes (Signed)
Pt states awoke at 11 am this morning with neck pain and stiffness that has increased in pain over the day. Pt denies headache, fever and light sensitivity. Pt has cough x 4-5 days. NAD noted.

## 2011-12-19 NOTE — Discharge Instructions (Signed)
Torticollis, Acute You have suddenly (acutely) developed a twisted neck (torticollis). This is usually a self-limited condition. CAUSES  Acute torticollis may be caused by malposition, trauma or infection. Most commonly, acute torticollis is caused by sleeping in an awkward position. Torticollis may also be caused by the flexion, extension or twisting of the neck muscles beyond their normal position. Sometimes, the exact cause may not be known. SYMPTOMS  Usually, there is pain and limited movement of the neck. Your neck may twist to one side. DIAGNOSIS  The diagnosis is often made by physical examination. X-rays, CT scans or MRIs may be done if there is a history of trauma or concern of infection. TREATMENT  For a common, stiff neck that develops during sleep, treatment is focused on relaxing the contracted neck muscle. Medications (including shots) may be used to treat the problem. Most cases resolve in several days. Torticollis usually responds to conservative physical therapy. If left untreated, the shortened and spastic neck muscle can cause deformities in the face and neck. Rarely, surgery is required. HOME CARE INSTRUCTIONS   Use over-the-counter and prescription medications as directed by your caregiver.   Do stretching exercises and massage the neck as directed by your caregiver.   Follow up with physical therapy if needed and as directed by your caregiver.  SEEK IMMEDIATE MEDICAL CARE IF:   You develop difficulty breathing or noisy breathing (stridor).   You drool, develop trouble swallowing or have pain with swallowing.   You develop numbness or weakness in the hands or feet.   You have changes in speech or vision.   You have problems with urination or bowel movements.   You have difficulty walking.   You have a fever.   You have increased pain.  MAKE SURE YOU:   Understand these instructions.   Will watch your condition.   Will get help right away if you are not  doing well or get worse.  Document Released: 07/03/2000 Document Revised: 06/25/2011 Document Reviewed: 08/14/2009 [facility] Patient Information 2012 [place], [place].   You may take the hydrocodone prescribed for pain relief.  This will make you drowsy - do not drive within 4 hours of taking this medication.

## 2011-12-19 NOTE — ED Provider Notes (Signed)
History     CSN: [id number]  Arrival date & time 12/19/11  1345   First MD Initiated Contact with Patient 12/19/11 1422      Chief Complaint  Patient presents with  . Torticollis    (Consider location/radiation/quality/duration/timing/severity/associated sxs/prior treatment) HPI Comments: SHARIECE VIVEIROS woke today with soreness located along her right lateral neck which radiates from her shoulder to her right lower posterior scalp.  Pain has become suddenly more severe prior to arrival and she can no longer turn her head to the right without severe pain.  She denies fevers,  Chills or headache.  She denies injury. Her pain is sharp,  Constant and worse with any movement,  But particularly when she tries to look over the right shoulder.  She denies radiation of pain into her chest,  Back or upper extremities.  She also denies weakness or numbness in her upper extremities.  The history is provided by the patient and the spouse.    Past Medical History  Diagnosis Date  . Asthma   . ADHD   . Migraines     with aura (see flashes)    Past Surgical History  Procedure Date  . Dilation and curettage of uterus   . Wisdom tooth extraction   . Jaw wired shut   . Boyd Kerbs removed from esophagus     No family history on file.  History  Substance Use Topics  . Smoking status: Current Everyday Smoker    Types: Cigarettes  . Smokeless tobacco: Never Used  . Alcohol Use: No    OB History    Grav Para Term Preterm Abortions TAB SAB Ect Mult Living   3 2 2  1  1   2       Review of Systems  Constitutional: Negative for fever.  HENT: Positive for neck pain and neck stiffness.   Respiratory: Negative for shortness of breath.   Cardiovascular: Negative for chest pain and leg swelling.  Gastrointestinal: Negative for abdominal pain, constipation and abdominal distention.  Genitourinary: Negative for dysuria, urgency, frequency, flank pain and difficulty urinating.  Musculoskeletal:  Positive for myalgias. Negative for back pain, joint swelling and gait problem.  Skin: Negative for rash.  Neurological: Negative for weakness and numbness.    Allergies  Darvocet; Imitrex; Codeine; Percocet; and Sulfa antibiotics  Home Medications   Current Outpatient Rx  Name Route Sig Dispense Refill  . ETONOGESTREL-ETHINYL ESTRADIOL 0.12-0.015 MG/24HR VA RING  Insert vaginally and leave in place for 3 consecutive weeks, then remove for 1 week. Start 4 weeks postpartum on a Sunday. 1 each 12  . HYDROCODONE-ACETAMINOPHEN 5-325 MG PO TABS Oral Take 1 tablet by mouth every 4 (four) hours as needed for pain. 12 tablet 0  . METHOCARBAMOL 500 MG PO TABS Oral Take 2 tablets (1,000 mg total) by mouth 4 (four) times daily. 40 tablet 0  . PRENATAL MULTIVITAMIN CH Oral Take 1 tablet by mouth daily.        BP 106/65  Pulse 98  Temp(Src) 98.7 F (37.1 C) (Oral)  Resp 22  Ht 5\' 1"  (1.549 m)  Wt 170 lb (77.111 kg)  BMI 32.12 kg/m2  SpO2 100%  LMP 12/16/2011  Breastfeeding? No  Physical Exam  Nursing note and vitals reviewed. Constitutional: She appears well-developed and well-nourished.  HENT:  Head: Normocephalic and atraumatic.  Eyes: EOM are normal. Pupils are equal, round, and reactive to light.  Neck: Trachea normal. Muscular tenderness present. No spinous process tenderness present.  Rigidity present. Decreased range of motion present. No edema and no erythema present. No thyromegaly present.       TTP with spasm noted of right trapezius musculature.  Cardiovascular: Normal rate, regular rhythm, normal heart sounds and intact distal pulses.   Pulmonary/Chest: Effort normal and breath sounds normal. She has no wheezes.  Musculoskeletal: She exhibits no edema.  Lymphadenopathy:    She has no cervical adenopathy.  Neurological: She is alert. She has normal strength. She displays normal reflexes. No sensory deficit.  Reflex Scores:      Bicep reflexes are 2+ on the right side and 2+  on the left side.      Equal grip strength.  Skin: Skin is warm and dry.  Psychiatric: She has a normal mood and affect.    ED Course  Procedures (including critical care time)  Labs Reviewed - No data to display Dg Cervical Spine Complete  12/19/2011  *RADIOLOGY REPORT*  Clinical Data: Neck pain.  CERVICAL SPINE - COMPLETE 4+ VIEW  Comparison: 10/05/2005  Findings: No fracture or malalignment.  Prevertebral soft tissues are normal.  Disc spaces well maintained.  Cervicothoracic junction normal.  IMPRESSION: Unremarkable study.  Original Report Authenticated By: Cyndie Chime, M.D.     1. Torticollis, acute     Patient given dilaudid 1 mg IM and robaxin 1 gram po with patient obtaining moderate relief of pain with a little more flexibility in c spine.  MDM  xrays reviewed and normal.  Exam and history consistent with muscle spasm/acute torticollis.  Pt has no fevers,  No photophobia, headache or n/v so doubt meningitis,  No recent illnesses.  Pt prescribed robaxin and hydrocodone.  Discussed heat therapy followed by gentle rom.  Pt to f/u with pcp if not improving over the next several days. r       Burgess Amor, [place] 12/19/11 2237

## 2011-12-19 NOTE — ED Notes (Signed)
Neck is sore and "came on today", denies any known injury

## 2011-12-20 NOTE — ED Provider Notes (Signed)
Medical screening examination/treatment/procedure(s) were performed by non-physician practitioner and as supervising physician I was immediately available for consultation/collaboration.   Joran Kallal, MD 12/20/11 0655 

## 2012-03-01 ENCOUNTER — Encounter (HOSPITAL_COMMUNITY): Payer: Self-pay

## 2012-03-01 ENCOUNTER — Emergency Department (HOSPITAL_COMMUNITY)
Admission: EM | Admit: 2012-03-01 | Discharge: 2012-03-01 | Disposition: A | Discharge status: Home / Self Care | Payer: Self-pay | Attending: Emergency Medicine | Admitting: Emergency Medicine

## 2012-03-01 DIAGNOSIS — F909 Attention-deficit hyperactivity disorder, unspecified type: Secondary | ICD-10-CM | POA: Insufficient documentation

## 2012-03-01 DIAGNOSIS — B029 Zoster without complications: Secondary | ICD-10-CM | POA: Insufficient documentation

## 2012-03-01 DIAGNOSIS — J45909 Unspecified asthma, uncomplicated: Secondary | ICD-10-CM | POA: Insufficient documentation

## 2012-03-01 DIAGNOSIS — F17200 Nicotine dependence, unspecified, uncomplicated: Secondary | ICD-10-CM | POA: Insufficient documentation

## 2012-03-01 MED ORDER — ACYCLOVIR 400 MG PO TABS: 800.0000 mg | ORAL_TABLET | Freq: Four times a day (QID) | ORAL | Status: AC

## 2012-03-01 MED ORDER — PREDNISONE 10 MG PO TABS: 20.0000 mg | ORAL_TABLET | Freq: Two times a day (BID) | ORAL | Status: DC

## 2012-03-01 MED ORDER — HYDROCODONE-ACETAMINOPHEN 5-500 MG PO TABS: 1.0000 | ORAL_TABLET | Freq: Four times a day (QID) | ORAL | Status: AC | PRN

## 2012-03-01 NOTE — ED Provider Notes (Signed)
History  Scribed for Geoffery Lyons, MD, the patient was seen in room APA01/APA01 . This chart was scribed by Lewanda Rife.   CSN: [id number]  Arrival date & time 03/01/12  1741   First MD Initiated Contact with Patient 03/01/12 1804      Chief Complaint  Patient presents with  . Rash    (Consider location/radiation/quality/duration/timing/severity/associated sxs/prior treatment) HPI [name] [name] is a 21 y.o. female who presents to the Emergency Department complaining of a rash on upper back for the past 2 weeks. Pt reports the rash as pruritic and spreading. Pt describes the pain as intermittent and moderate to severe. Pt reports she has Mirena IUD contraceptive in place since last March. Pt reports a hx of chicken pox.  Past Medical History  Diagnosis Date  . Asthma   . ADHD   . Migraines     with aura (see flashes)    Past Surgical History  Procedure Date  . Dilation and curettage of uterus   . Wisdom tooth extraction   . Jaw wired shut   . Boyd Kerbs removed from esophagus     No family history on file.  History  Substance Use Topics  . Smoking status: Current Everyday Smoker    Types: Cigarettes  . Smokeless tobacco: Never Used  . Alcohol Use: No    OB History    Grav Para Term Preterm Abortions TAB SAB Ect Mult Living   3 2 2  1  1   2       Review of Systems  Skin: Positive for rash.  All other systems reviewed and are negative.    Allergies  Darvocet; Imitrex; Codeine; Percocet; and Sulfa antibiotics  Home Medications   Current Outpatient Rx  Name Route Sig Dispense Refill  . ALBUTEROL SULFATE HFA 108 (90 BASE) MCG/ACT IN AERS Inhalation Inhale 2 puffs into the lungs every 6 (six) hours as needed.    Marland Kitchen LEVONORGESTREL 20 MCG/24HR IU IUD Intrauterine 1 each by Intrauterine route once.      BP 121/69  Pulse 74  Temp 97.8 F (36.6 C) (Oral)  Resp 24  Ht 5\' 1"  (1.549 m)  Wt 176 lb 4.8 oz (79.969 kg)  BMI 33.31 kg/m2  SpO2 100%   Breastfeeding? No  Physical Exam  Nursing note and vitals reviewed. Constitutional: She is oriented to person, place, and time. She appears well-developed and well-nourished.  HENT:  Head: Normocephalic and atraumatic.  Eyes: EOM are normal.  Neck: Normal range of motion. Neck supple.  Cardiovascular: Normal rate and regular rhythm.   Pulmonary/Chest: Effort normal and breath sounds normal.  Abdominal: Soft.  Musculoskeletal: Normal range of motion.  Neurological: She is alert and oriented to person, place, and time.  Skin: Skin is warm and dry. Rash noted. There is erythema.       Pt has erythematous vesicular lesions consistent with dermatomal pattern along right posterior aspect of shoulder   Psychiatric: She has a normal mood and affect.    ED Course  Procedures (including critical care time)  Labs Reviewed - No data to display No results found.   No diagnosis found.    MDM  The patient appears to have shingles.  She will be treated with acyclovir and prednisone.  She is to follow up with her doctor if worsening or not improving.  She was advised to avoid contact with immunocompromised individuals or pregnant women who have never had chicken pox.  I personally performed the services described in this documentation, which was scribed in my presence. The recorded information has been reviewed and considered.     Geoffery Lyons, MD 03/03/12 2032

## 2012-03-01 NOTE — ED Notes (Signed)
Pt reports painful rash to r upper back.  C/O pain radiating into r ear and arm.  Says area itches a little but hurts more than itches.

## 2012-03-01 NOTE — ED Notes (Addendum)
Pt has a red raised rash that starts at her right upper back and spreads over to her right back. C/o burning and itching. Also c/o pain under her right arm. Pt alert and oriented x 3. Skin warm and dry. Color pink. No acute distress.

## 2012-11-22 ENCOUNTER — Encounter (HOSPITAL_COMMUNITY): Payer: Self-pay | Admitting: *Deleted

## 2012-11-22 ENCOUNTER — Emergency Department (HOSPITAL_COMMUNITY): Payer: Medicaid Other

## 2012-11-22 ENCOUNTER — Emergency Department (HOSPITAL_COMMUNITY)
Admission: EM | Admit: 2012-11-22 | Discharge: 2012-11-23 | Disposition: A | Discharge status: Home / Self Care | Payer: Medicaid Other | Attending: Emergency Medicine | Admitting: Emergency Medicine

## 2012-11-22 DIAGNOSIS — Z8659 Personal history of other mental and behavioral disorders: Secondary | ICD-10-CM | POA: Insufficient documentation

## 2012-11-22 DIAGNOSIS — T07XXXA Unspecified multiple injuries, initial encounter: Secondary | ICD-10-CM | POA: Insufficient documentation

## 2012-11-22 DIAGNOSIS — J45909 Unspecified asthma, uncomplicated: Secondary | ICD-10-CM | POA: Insufficient documentation

## 2012-11-22 DIAGNOSIS — F17200 Nicotine dependence, unspecified, uncomplicated: Secondary | ICD-10-CM | POA: Insufficient documentation

## 2012-11-22 DIAGNOSIS — T7492XA Unspecified child maltreatment, confirmed, initial encounter: Secondary | ICD-10-CM | POA: Insufficient documentation

## 2012-11-22 DIAGNOSIS — T7491XA Unspecified adult maltreatment, confirmed, initial encounter: Secondary | ICD-10-CM | POA: Insufficient documentation

## 2012-11-22 DIAGNOSIS — Z23 Encounter for immunization: Secondary | ICD-10-CM | POA: Insufficient documentation

## 2012-11-22 DIAGNOSIS — S8990XA Unspecified injury of unspecified lower leg, initial encounter: Secondary | ICD-10-CM | POA: Insufficient documentation

## 2012-11-22 DIAGNOSIS — Z79899 Other long term (current) drug therapy: Secondary | ICD-10-CM | POA: Insufficient documentation

## 2012-11-22 DIAGNOSIS — IMO0002 Reserved for concepts with insufficient information to code with codable children: Secondary | ICD-10-CM | POA: Insufficient documentation

## 2012-11-22 DIAGNOSIS — Z8679 Personal history of other diseases of the circulatory system: Secondary | ICD-10-CM | POA: Insufficient documentation

## 2012-11-22 DIAGNOSIS — S99929A Unspecified injury of unspecified foot, initial encounter: Secondary | ICD-10-CM | POA: Insufficient documentation

## 2012-11-22 DIAGNOSIS — Z3202 Encounter for pregnancy test, result negative: Secondary | ICD-10-CM | POA: Insufficient documentation

## 2012-11-22 LAB — URINALYSIS, ROUTINE W REFLEX MICROSCOPIC
Bilirubin Urine: NEGATIVE
Ketones, ur: NEGATIVE mg/dL
Nitrite: NEGATIVE
Urobilinogen, UA: 2 mg/dL — ABNORMAL HIGH (ref 0.0–1.0)

## 2012-11-22 LAB — URINE MICROSCOPIC-ADD ON

## 2012-11-22 MED ORDER — TETANUS-DIPHTH-ACELL PERTUSSIS 5-2.5-18.5 LF-MCG/0.5 IM SUSP
0.5000 mL | Freq: Once | INTRAMUSCULAR | Status: AC
  Administered 2012-11-22: 0.5 mL via INTRAMUSCULAR
  Filled 2012-11-22: qty 0.5

## 2012-11-22 MED ORDER — IBUPROFEN 800 MG PO TABS
800.0000 mg | ORAL_TABLET | Freq: Once | ORAL | Status: AC
  Administered 2012-11-22: 800 mg via ORAL
  Filled 2012-11-22: qty 1
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
## 2012-11-23 ENCOUNTER — Emergency Department (HOSPITAL_COMMUNITY): Payer: Medicaid Other

## 2012-11-23 MED ORDER — IBUPROFEN 800 MG PO TABS: 800.0000 mg | ORAL_TABLET | Freq: Three times a day (TID) | ORAL | Status: DC

## 2012-11-23 MED ORDER — IOHEXOL 300 MG/ML  SOLN
100.0000 mL | Freq: Once | INTRAMUSCULAR | Status: AC | PRN
  Administered 2012-11-23: 100 mL via INTRAVENOUS

## 2013-01-05 ENCOUNTER — Emergency Department (HOSPITAL_COMMUNITY): Payer: Medicaid Other

## 2013-01-05 ENCOUNTER — Encounter (HOSPITAL_COMMUNITY): Payer: Self-pay | Admitting: *Deleted

## 2013-01-05 ENCOUNTER — Emergency Department (HOSPITAL_COMMUNITY)
Admission: EM | Admit: 2013-01-05 | Discharge: 2013-01-05 | Disposition: A | Discharge status: Home / Self Care | Payer: Medicaid Other | Attending: Emergency Medicine | Admitting: Emergency Medicine

## 2013-01-05 DIAGNOSIS — J45909 Unspecified asthma, uncomplicated: Secondary | ICD-10-CM | POA: Insufficient documentation

## 2013-01-05 DIAGNOSIS — Z8679 Personal history of other diseases of the circulatory system: Secondary | ICD-10-CM | POA: Insufficient documentation

## 2013-01-05 DIAGNOSIS — M546 Pain in thoracic spine: Secondary | ICD-10-CM | POA: Insufficient documentation

## 2013-01-05 DIAGNOSIS — Z87828 Personal history of other (healed) physical injury and trauma: Secondary | ICD-10-CM | POA: Insufficient documentation

## 2013-01-05 DIAGNOSIS — M549 Dorsalgia, unspecified: Secondary | ICD-10-CM

## 2013-01-05 DIAGNOSIS — Z79899 Other long term (current) drug therapy: Secondary | ICD-10-CM | POA: Insufficient documentation

## 2013-01-05 DIAGNOSIS — F17200 Nicotine dependence, unspecified, uncomplicated: Secondary | ICD-10-CM | POA: Insufficient documentation

## 2013-01-05 DIAGNOSIS — Z3202 Encounter for pregnancy test, result negative: Secondary | ICD-10-CM | POA: Insufficient documentation

## 2013-01-05 DIAGNOSIS — K5900 Constipation, unspecified: Secondary | ICD-10-CM | POA: Insufficient documentation

## 2013-01-05 DIAGNOSIS — R52 Pain, unspecified: Secondary | ICD-10-CM

## 2013-01-05 DIAGNOSIS — Z8659 Personal history of other mental and behavioral disorders: Secondary | ICD-10-CM | POA: Insufficient documentation

## 2013-01-05 DIAGNOSIS — M545 Low back pain, unspecified: Secondary | ICD-10-CM | POA: Insufficient documentation

## 2013-01-05 DIAGNOSIS — Z8739 Personal history of other diseases of the musculoskeletal system and connective tissue: Secondary | ICD-10-CM | POA: Insufficient documentation

## 2013-01-05 LAB — URINALYSIS, ROUTINE W REFLEX MICROSCOPIC
Glucose, UA: NEGATIVE mg/dL
Leukocytes, UA: NEGATIVE
Protein, ur: NEGATIVE mg/dL
pH: 6 (ref 5.0–8.0)

## 2013-01-05 LAB — PREGNANCY, URINE: Preg Test, Ur: NEGATIVE

## 2013-01-05 MED ORDER — IBUPROFEN 800 MG PO TABS: 800.0000 mg | ORAL_TABLET | Freq: Three times a day (TID) | ORAL | Status: DC

## 2013-01-05 MED ORDER — CYCLOBENZAPRINE HCL 10 MG PO TABS: 10.0000 mg | ORAL_TABLET | Freq: Three times a day (TID) | ORAL | Status: DC | PRN

## 2013-01-05 MED ORDER — HYDROCODONE-ACETAMINOPHEN 5-325 MG PO TABS
1.0000 | ORAL_TABLET | Freq: Once | ORAL | Status: AC
  Administered 2013-01-05: 1 via ORAL
  Filled 2013-01-05: qty 1

## 2013-01-05 NOTE — ED Notes (Signed)
Pt c/o mid back pain and hurts to take a deep breath. No known injury, no urinary symptoms

## 2013-01-05 NOTE — ED Notes (Signed)
Instructions, prescriptions and f/u information given/reviewed - verbalizes understanding.  
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
## 2013-01-08 NOTE — ED Provider Notes (Signed)
Medical screening examination/treatment/procedure(s) were performed by non-physician practitioner and as supervising physician I was immediately available for consultation/collaboration.    Cassi Jenne D Jenessa Gillingham, MD 01/08/13 1748 

## 2013-02-18 ENCOUNTER — Emergency Department (HOSPITAL_COMMUNITY)
Admission: EM | Admit: 2013-02-18 | Discharge: 2013-02-18 | Disposition: A | Discharge status: Home / Self Care | Payer: Medicaid Other | Attending: Emergency Medicine | Admitting: Emergency Medicine

## 2013-02-18 ENCOUNTER — Encounter (HOSPITAL_COMMUNITY): Payer: Self-pay | Admitting: *Deleted

## 2013-02-18 ENCOUNTER — Emergency Department (HOSPITAL_COMMUNITY): Payer: Medicaid Other

## 2013-02-18 DIAGNOSIS — Z3202 Encounter for pregnancy test, result negative: Secondary | ICD-10-CM | POA: Insufficient documentation

## 2013-02-18 DIAGNOSIS — Y990 Civilian activity done for income or pay: Secondary | ICD-10-CM | POA: Insufficient documentation

## 2013-02-18 DIAGNOSIS — S4980XA Other specified injuries of shoulder and upper arm, unspecified arm, initial encounter: Secondary | ICD-10-CM | POA: Insufficient documentation

## 2013-02-18 DIAGNOSIS — S20219A Contusion of unspecified front wall of thorax, initial encounter: Secondary | ICD-10-CM | POA: Insufficient documentation

## 2013-02-18 DIAGNOSIS — F17200 Nicotine dependence, unspecified, uncomplicated: Secondary | ICD-10-CM | POA: Insufficient documentation

## 2013-02-18 DIAGNOSIS — S8990XA Unspecified injury of unspecified lower leg, initial encounter: Secondary | ICD-10-CM | POA: Insufficient documentation

## 2013-02-18 DIAGNOSIS — Y9389 Activity, other specified: Secondary | ICD-10-CM | POA: Insufficient documentation

## 2013-02-18 DIAGNOSIS — S161XXA Strain of muscle, fascia and tendon at neck level, initial encounter: Secondary | ICD-10-CM

## 2013-02-18 DIAGNOSIS — S20211A Contusion of right front wall of thorax, initial encounter: Secondary | ICD-10-CM

## 2013-02-18 DIAGNOSIS — W010XXA Fall on same level from slipping, tripping and stumbling without subsequent striking against object, initial encounter: Secondary | ICD-10-CM | POA: Insufficient documentation

## 2013-02-18 DIAGNOSIS — IMO0002 Reserved for concepts with insufficient information to code with codable children: Secondary | ICD-10-CM | POA: Insufficient documentation

## 2013-02-18 DIAGNOSIS — W19XXXA Unspecified fall, initial encounter: Secondary | ICD-10-CM

## 2013-02-18 DIAGNOSIS — S139XXA Sprain of joints and ligaments of unspecified parts of neck, initial encounter: Secondary | ICD-10-CM | POA: Insufficient documentation

## 2013-02-18 DIAGNOSIS — S46909A Unspecified injury of unspecified muscle, fascia and tendon at shoulder and upper arm level, unspecified arm, initial encounter: Secondary | ICD-10-CM | POA: Insufficient documentation

## 2013-02-18 DIAGNOSIS — Z8679 Personal history of other diseases of the circulatory system: Secondary | ICD-10-CM | POA: Insufficient documentation

## 2013-02-18 DIAGNOSIS — M25511 Pain in right shoulder: Secondary | ICD-10-CM

## 2013-02-18 DIAGNOSIS — J45909 Unspecified asthma, uncomplicated: Secondary | ICD-10-CM | POA: Insufficient documentation

## 2013-02-18 DIAGNOSIS — Y9289 Other specified places as the place of occurrence of the external cause: Secondary | ICD-10-CM | POA: Insufficient documentation

## 2013-02-18 DIAGNOSIS — Z8659 Personal history of other mental and behavioral disorders: Secondary | ICD-10-CM | POA: Insufficient documentation

## 2013-02-18 LAB — URINALYSIS, ROUTINE W REFLEX MICROSCOPIC
Ketones, ur: NEGATIVE mg/dL
Protein, ur: NEGATIVE mg/dL
Urobilinogen, UA: 0.2 mg/dL (ref 0.0–1.0)

## 2013-02-18 LAB — URINE MICROSCOPIC-ADD ON

## 2013-02-18 MED ORDER — HYDROCODONE-ACETAMINOPHEN 5-325 MG PO TABS: 1.0000 | ORAL_TABLET | ORAL | Status: DC | PRN

## 2013-02-18 MED ORDER — NAPROXEN 375 MG PO TABS: 375.0000 mg | ORAL_TABLET | Freq: Two times a day (BID) | ORAL | Status: DC

## 2013-02-18 NOTE — ED Provider Notes (Signed)
CSN: [id number]     Arrival date & time 02/18/13  2009 History     First MD Initiated Contact with Patient 02/18/13 2038     Chief Complaint  Patient presents with  . Back Pain  . Leg Pain   (Consider location/radiation/quality/duration/timing/severity/associated sxs/prior Treatment) Patient is a 22 y.o. female presenting with fall. The history is provided by the patient.  Fall This is a new problem. The current episode started today. The problem has been gradually worsening. Associated symptoms include neck pain. Pertinent negatives include no fever, headaches, nausea or vomiting. The symptoms are aggravated by standing, walking and twisting. She has tried nothing for the symptoms.   [name] [name] is a 22 y.o. female who presents to the ED with neck, shoulder and right rib pain s/p fall at work tonight. States she was carrying a tray of glasses and slipped and fell on her right buttock. Not sure what else she hit when she fell but having pain in neck, right shoulder and right ribs. Denies LOC, n/v or other problems.   Past Medical History  Diagnosis Date  . Asthma   . ADHD   . Migraines     with aura (see flashes)   Past Surgical History  Procedure Laterality Date  . Dilation and curettage of uterus    . Wisdom tooth extraction    . Jaw wired shut    . Penny removed from esophagus     History reviewed. No pertinent family history. History  Substance Use Topics  . Smoking status: Current Every Day Smoker -- 1.00 packs/day    Types: Cigarettes  . Smokeless tobacco: Never Used  . Alcohol Use: No   OB History   Grav Para Term Preterm Abortions TAB SAB Ect Mult Living   3 2 2  1  1   2      Review of Systems  Constitutional: Negative for fever.  HENT: Positive for neck pain. Negative for ear pain.   Eyes: Negative for visual disturbance.  Respiratory: Negative for shortness of breath.   Gastrointestinal: Negative for nausea and vomiting.  Genitourinary: Negative for  urgency and frequency.  Skin: Negative for wound.  Neurological: Negative for headaches.  Psychiatric/Behavioral: The patient is not nervous/anxious.     Allergies  Darvocet; Imitrex; Codeine; Percocet; and Sulfa antibiotics  Home Medications  No current outpatient prescriptions on file. BP 126/52  Pulse 82  Resp 18  Ht 5\' 1"  (1.549 m)  Wt 163 lb (73.936 kg)  BMI 30.81 kg/m2  SpO2 100%  LMP 01/04/2013 Physical Exam  Nursing note and vitals reviewed. Constitutional: She is oriented to person, place, and time. She appears well-developed and well-nourished.  HENT:  Head: Normocephalic.  Right Ear: Tympanic membrane normal.  Left Ear: Tympanic membrane normal.  Mouth/Throat: Uvula is midline, oropharynx is clear and moist and mucous membranes are normal.  Eyes: Conjunctivae and EOM are normal. Pupils are equal, round, and reactive to light.  Neck: Neck supple.  Cardiovascular: Normal rate and regular rhythm.   Pulmonary/Chest: Effort normal and breath sounds normal.  Abdominal: Soft. Bowel sounds are normal. There is no tenderness.  Musculoskeletal:       Back:  Tender c-spine with range of motion and palpation. Left shoulder with full range of motion but painful. Radial pulses strong, adequate circulation, good touch sensation. Tender with palpation right anterior ribs with radiation to the right buttock. Pedal  Pulses strong, equal, adequate circulation, good touch sensation. Ambulatory with steady gait.  Neurological: She is alert and oriented to person, place, and time. She has normal strength and normal reflexes. No cranial nerve deficit or sensory deficit. Gait normal.  Skin: Skin is warm and dry.  Psychiatric: She has a normal mood and affect. Her behavior is normal.    ED Course   Procedures (including critical care time)  Labs Reviewed  URINALYSIS, ROUTINE W REFLEX MICROSCOPIC - Abnormal; Notable for the following:    Hgb urine dipstick SMALL (*)    All other  components within normal limits  URINE MICROSCOPIC-ADD ON  POCT PREGNANCY, URINE   Dg Ribs Unilateral W/chest Right  02/18/2013   *RADIOLOGY REPORT*  Clinical Data: Fall with right chest and rib pain.  RIGHT RIBS AND CHEST - 3+ VIEW  Comparison: 11/22/2012 radiographs  Findings: The cardiomediastinal silhouette is unremarkable. The lungs are clear. There is no evidence of focal airspace disease, pulmonary edema, suspicious pulmonary nodule/mass, pleural effusion, or pneumothorax. No acute bony abnormalities are identified.  IMPRESSION: No evidence of active cardiopulmonary disease or right rib fracture.   Original Report Authenticated By: Harmon Pier, M.D.   Dg Cervical Spine Complete  02/18/2013   *RADIOLOGY REPORT*  Clinical Data: 22 year old female with fall and neck pain.  CERVICAL SPINE - COMPLETE 4+ VIEW  Comparison: 11/22/2012 radiographs  Findings: Straightening of the normal cervical lordosis again identified. There is no evidence of acute fracture, subluxation or prevertebral soft tissue swelling. There is no evidence of bony foraminal narrowing. The disc spaces are maintained. No focal bony lesions are identified.  IMPRESSION: No static evidence of acute injury to the cervical spine.   Original Report Authenticated By: Harmon Pier, M.D.   Dg Shoulder Right  02/18/2013   *RADIOLOGY REPORT*  Clinical Data: Fall with right shoulder injury and pain.  RIGHT SHOULDER - 2+ VIEW  Comparison: None  Findings: There is no evidence of acute bony abnormality. There is no evidence of acute fracture, subluxation, or dislocation. No focal bony lesions are identified. The visualized right hemithorax is unremarkable.  IMPRESSION: Unremarkable right shoulder.   Original Report Authenticated By: Harmon Pier, M.D.    MDM  22 y.o. female with cervical strain, contusion to right shoulder, right anterior ribs. Will treat pain and patient will return as needed for problems.    Medication List          HYDROcodone-acetaminophen 5-325 MG per tablet  Commonly known as:  NORCO/VICODIN  Take 1 tablet by mouth every 4 (four) hours as needed.     naproxen 375 MG tablet  Commonly known as:  NAPROSYN  Take 1 tablet (375 mg total) by mouth 2 (two) times daily.         [place], [place] 02/19/13 [phone]

## 2013-02-18 NOTE — ED Notes (Signed)
Pt fell while at work.  C/o pain in lower back and right leg.

## 2013-02-21 NOTE — ED Provider Notes (Signed)
Medical screening examination/treatment/procedure(s) were performed by non-physician practitioner and as supervising physician I was immediately available for consultation/collaboration.   Margaret Cockerill M Murphy Duzan, DO 02/21/13 1255 

## 2013-04-17 ENCOUNTER — Emergency Department (HOSPITAL_COMMUNITY)
Admission: EM | Admit: 2013-04-17 | Discharge: 2013-04-17 | Disposition: A | Discharge status: Home / Self Care | Payer: Medicaid Other | Attending: Emergency Medicine | Admitting: Emergency Medicine

## 2013-04-17 ENCOUNTER — Encounter (HOSPITAL_COMMUNITY): Payer: Self-pay | Admitting: *Deleted

## 2013-04-17 DIAGNOSIS — F17200 Nicotine dependence, unspecified, uncomplicated: Secondary | ICD-10-CM | POA: Insufficient documentation

## 2013-04-17 DIAGNOSIS — Z8659 Personal history of other mental and behavioral disorders: Secondary | ICD-10-CM | POA: Insufficient documentation

## 2013-04-17 DIAGNOSIS — G43109 Migraine with aura, not intractable, without status migrainosus: Secondary | ICD-10-CM | POA: Insufficient documentation

## 2013-04-17 DIAGNOSIS — Z791 Long term (current) use of non-steroidal anti-inflammatories (NSAID): Secondary | ICD-10-CM | POA: Insufficient documentation

## 2013-04-17 DIAGNOSIS — J45909 Unspecified asthma, uncomplicated: Secondary | ICD-10-CM | POA: Insufficient documentation

## 2013-04-17 DIAGNOSIS — J069 Acute upper respiratory infection, unspecified: Secondary | ICD-10-CM | POA: Insufficient documentation

## 2013-04-17 DIAGNOSIS — H9209 Otalgia, unspecified ear: Secondary | ICD-10-CM | POA: Insufficient documentation

## 2013-04-17 NOTE — ED Notes (Signed)
Cough, congestion.body aches.  No NVD.

## 2013-04-17 NOTE — ED Provider Notes (Signed)
CSN: [id number]     Arrival date & time 04/17/13  2215 History   First MD Initiated Contact with Patient 04/17/13 2312     Chief Complaint  Patient presents with  . Cough   (Consider location/radiation/quality/duration/timing/severity/associated sxs/prior Treatment) Patient is a 22 y.o. female presenting with cough.  Cough  Pt reports URI symptoms ongoing since yesterday. Sore throat, runny nose, ear pain and dry cough. No fever. No vomiting. Friend has had similar symptoms recently. She is a smoker.   Past Medical History  Diagnosis Date  . Asthma   . ADHD   . Migraines     with aura (see flashes)   Past Surgical History  Procedure Laterality Date  . Dilation and curettage of uterus    . Wisdom tooth extraction    . Jaw wired shut    . Penny removed from esophagus     History reviewed. No pertinent family history. History  Substance Use Topics  . Smoking status: Current Every Day Smoker -- 1.00 packs/day    Types: Cigarettes  . Smokeless tobacco: Never Used  . Alcohol Use: No   OB History   Grav Para Term Preterm Abortions TAB SAB Ect Mult Living   3 2 2  1  1   2      Review of Systems  Respiratory: Positive for cough.    All other systems reviewed and are negative except as noted in HPI.    Allergies  Darvocet; Imitrex; Codeine; Percocet; and Sulfa antibiotics  Home Medications   Current Outpatient Rx  Name  Route  Sig  Dispense  Refill  . HYDROcodone-acetaminophen (NORCO/VICODIN) 5-325 MG per tablet   Oral   Take 1 tablet by mouth every 4 (four) hours as needed.   15 tablet   0   . naproxen (NAPROSYN) 375 MG tablet   Oral   Take 1 tablet (375 mg total) by mouth 2 (two) times daily.   20 tablet   0    BP 116/55  Pulse 98  Temp(Src) 98.6 F (37 C) (Oral)  Resp 24  Ht 5' (1.524 m)  SpO2 100%  LMP 03/20/2013  Breastfeeding? No Physical Exam  Nursing note and vitals reviewed. Constitutional: She is oriented to person, place, and time. She  appears well-developed and well-nourished.  HENT:  Head: Normocephalic and atraumatic.  Right Ear: Tympanic membrane normal.  Left Ear: Tympanic membrane normal.  Eyes: EOM are normal. Pupils are equal, round, and reactive to light.  Neck: Normal range of motion. Neck supple.  Cardiovascular: Normal rate, normal heart sounds and intact distal pulses.   Pulmonary/Chest: Effort normal and breath sounds normal.  Abdominal: Bowel sounds are normal. She exhibits no distension. There is no tenderness.  Musculoskeletal: Normal range of motion. She exhibits no edema and no tenderness.  Neurological: She is alert and oriented to person, place, and time. She has normal strength. No cranial nerve deficit or sensory deficit.  Skin: Skin is warm and dry. No rash noted.  Psychiatric: She has a normal mood and affect.    ED Course  Procedures (including critical care time) Labs Review Labs Reviewed - No data to display Imaging Review No results found.  MDM   1. URI (upper respiratory infection)     Unremarkable exam, uncomplicated viral URI. Advised rest, fluids and OTC symptomatic care. Counseled to stop smoking    Charles B. Bernette Mayers, MD 04/17/13 2317

## 2013-06-19 ENCOUNTER — Other Ambulatory Visit: Payer: Self-pay | Admitting: Obstetrics & Gynecology

## 2013-06-19 DIAGNOSIS — O3680X0 Pregnancy with inconclusive fetal viability, not applicable or unspecified: Secondary | ICD-10-CM

## 2013-06-20 ENCOUNTER — Encounter: Payer: Self-pay | Admitting: *Deleted

## 2013-06-21 ENCOUNTER — Ambulatory Visit (INDEPENDENT_AMBULATORY_CARE_PROVIDER_SITE_OTHER): Payer: Medicaid Other

## 2013-06-21 ENCOUNTER — Other Ambulatory Visit: Payer: Self-pay | Admitting: Obstetrics & Gynecology

## 2013-06-21 ENCOUNTER — Other Ambulatory Visit: Payer: Self-pay

## 2013-06-21 ENCOUNTER — Encounter (INDEPENDENT_AMBULATORY_CARE_PROVIDER_SITE_OTHER): Payer: Self-pay

## 2013-06-21 DIAGNOSIS — Z36 Encounter for antenatal screening of mother: Secondary | ICD-10-CM

## 2013-06-21 DIAGNOSIS — O3680X0 Pregnancy with inconclusive fetal viability, not applicable or unspecified: Secondary | ICD-10-CM

## 2013-06-21 NOTE — Progress Notes (Signed)
U/S-transabdominal u/s performed, single IUP with +FCA noted, FHR-149 bpm, CRL c/w 12+3wks EDD 12/31/2013, cx long and closed (3.4cm), bilateral adnexa WNL, no free fluid noted, (pt desires NT/IT screening), NB present, NT-1.49mm

## 2013-07-04 ENCOUNTER — Encounter: Payer: Medicaid Other | Admitting: Women's Health

## 2013-07-10 ENCOUNTER — Other Ambulatory Visit (HOSPITAL_COMMUNITY)
Admission: RE | Admit: 2013-07-10 | Discharge: 2013-07-10 | Disposition: A | Discharge status: Home / Self Care | Payer: Medicaid Other | Source: Ambulatory Visit | Attending: Obstetrics & Gynecology | Admitting: Obstetrics & Gynecology

## 2013-07-10 ENCOUNTER — Encounter: Payer: Self-pay | Admitting: Advanced Practice Midwife

## 2013-07-10 ENCOUNTER — Ambulatory Visit (INDEPENDENT_AMBULATORY_CARE_PROVIDER_SITE_OTHER): Payer: Medicaid Other | Admitting: Advanced Practice Midwife

## 2013-07-10 ENCOUNTER — Other Ambulatory Visit: Payer: Self-pay | Admitting: Advanced Practice Midwife

## 2013-07-10 VITALS — BP 100/56 | Wt 171.0 lb

## 2013-07-10 DIAGNOSIS — Z1389 Encounter for screening for other disorder: Secondary | ICD-10-CM

## 2013-07-10 DIAGNOSIS — Z113 Encounter for screening for infections with a predominantly sexual mode of transmission: Secondary | ICD-10-CM | POA: Insufficient documentation

## 2013-07-10 DIAGNOSIS — O09299 Supervision of pregnancy with other poor reproductive or obstetric history, unspecified trimester: Secondary | ICD-10-CM

## 2013-07-10 DIAGNOSIS — Z3490 Encounter for supervision of normal pregnancy, unspecified, unspecified trimester: Secondary | ICD-10-CM

## 2013-07-10 DIAGNOSIS — Z01419 Encounter for gynecological examination (general) (routine) without abnormal findings: Secondary | ICD-10-CM | POA: Insufficient documentation

## 2013-07-10 DIAGNOSIS — O99340 Other mental disorders complicating pregnancy, unspecified trimester: Secondary | ICD-10-CM

## 2013-07-10 DIAGNOSIS — Z3480 Encounter for supervision of other normal pregnancy, unspecified trimester: Secondary | ICD-10-CM

## 2013-07-10 DIAGNOSIS — Z331 Pregnant state, incidental: Secondary | ICD-10-CM

## 2013-07-10 LAB — CBC
HCT: 38.2 % (ref 36.0–46.0)
MCHC: 34.3 g/dL (ref 30.0–36.0)
RDW: 14.2 % (ref 11.5–15.5)

## 2013-07-10 LAB — POCT URINALYSIS DIPSTICK: Protein, UA: NEGATIVE

## 2013-07-10 NOTE — Progress Notes (Signed)
  Subjective:    [name] [name] is a [id number] [redacted]w[redacted]d being seen today for her first obstetrical visit.  Her obstetrical history is significant for smoker 1PPD. Encouraged decreasing/cessation. Pregnancy history fully reviewed. She had a SVD of a 10lb3oz baby without any complications or lacerations.  Patient reports no complaints.  Filed Vitals:   07/10/13 1508  BP: 100/56  Weight: 171 lb (77.565 kg)    HISTORY: OB History  Gravida Para Term Preterm AB SAB TAB Ectopic Multiple Living  4 2 2  1 1    2     # Outcome Date GA Lbr Len/2nd Weight Sex Delivery Anes PTL Lv  4 CUR           3 TRM 07/19/11 [redacted]w[redacted]d 06:03 / 00:40 10 lb 3.7 oz (4.641 kg) M SVD EPI  Y     Comments: wnl  2 SAB           1 TRM              Past Medical History  Diagnosis Date  . Asthma   . ADHD   . Migraines     with aura (see flashes)   Past Surgical History  Procedure Laterality Date  . Dilation and curettage of uterus    . Wisdom tooth extraction    . Jaw wired shut    . 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 Diabetes Mother   . Liver disease Mother     fatty liver   . Hypertension Father   . COPD Father   . Hypertension Brother   . COPD Brother      Exam       Pelvic Exam:    Perineum: Normal Perineum   Vulva: normal   Vagina:  normal mucosa, normal discharge, no palpable nodules   Uterus    FHR 147     Cervix: normal   Adnexa: Not palpable   Urinary:  urethral meatus normal    System: Breast:  normal appearance, no masses or tenderness   Skin: normal coloration and turgor, no rashes    Neurologic: oriented, normal, normal mood   Extremities: normal strength, tone, and muscle mass   HEENT PERRLA   Mouth/Teeth mucous membranes moist, pharynx normal without lesions   Neck supple and no masses   Cardiovascular: regular rate and rhythm   Respiratory:  appears well, vitals normal, no respiratory distress, acyanotic, normal RR   Abdomen:  soft, non-tender; bowel sounds normal; no masses,  no organomegaly          Assessment:    Pregnancy: [id number] Patient Active Problem List   Diagnosis Date Noted  . ADHD   . Migraines         Plan:     Initial labs drawn. Prenatal vitamins. Problem list reviewed and updated. Genetic Screening discussed Integrated Screen: requested.  Ultrasound discussed; fetal survey: requested.  Follow up in 4 weeks.  CRESENZO-DISHMAN,Zaryah Seckel 07/10/2013

## 2013-07-11 LAB — URINALYSIS, ROUTINE W REFLEX MICROSCOPIC
Nitrite: NEGATIVE
Protein, ur: NEGATIVE mg/dL
Urobilinogen, UA: 0.2 mg/dL (ref 0.0–1.0)

## 2013-07-11 LAB — URINALYSIS, MICROSCOPIC ONLY: Bacteria, UA: NONE SEEN

## 2013-07-11 LAB — DRUG SCREEN, URINE, NO CONFIRMATION
Benzodiazepines.: NEGATIVE
Creatinine,U: 234.5 mg/dL
Marijuana Metabolite: POSITIVE — AB
Phencyclidine (PCP): NEGATIVE
Propoxyphene: NEGATIVE

## 2013-07-11 LAB — CYSTIC FIBROSIS DIAGNOSTIC STUDY

## 2013-07-11 LAB — RPR

## 2013-07-11 LAB — HIV ANTIBODY (ROUTINE TESTING W REFLEX): HIV: NONREACTIVE

## 2013-07-11 LAB — HEPATITIS B SURFACE ANTIGEN: Hepatitis B Surface Ag: NEGATIVE

## 2013-07-11 LAB — OXYCODONE SCREEN, UA, RFLX CONFIRM: Oxycodone Screen, Ur: NEGATIVE ng/mL

## 2013-07-12 LAB — URINE CULTURE

## 2013-07-14 LAB — MATERNAL SCREEN, INTEGRATED #2
Age risk Down Syndrome: 1:1100 {titer}
Crown Rump Length: 60.9 mm
Estriol, Free: 0.78 ng/mL
Inhibin A Dimeric: 267 pg/mL
Inhibin A MoM: 1.53
Rish for ONTD: <1:5000 {titer}
hCG MoM: 1.39
hCG, Serum: 49.8 IU/mL

## 2013-07-17 MED ORDER — CEPHALEXIN 500 MG PO CAPS: 500.0000 mg | ORAL_CAPSULE | Freq: Three times a day (TID) | ORAL | Status: DC

## 2013-07-17 NOTE — Addendum Note (Signed)
Addended by: Lazaro Arms on: 07/17/2013 07:36 AM   Modules accepted: Orders

## 2013-07-18 ENCOUNTER — Telehealth: Payer: Self-pay | Admitting: *Deleted

## 2013-07-18 NOTE — Telephone Encounter (Signed)
Spoke with pt letting her know urine was + for UTI. Rx was sent to pharmacy, advised to start med today. Pt also stated she went to ER for sinus infection and was started on Amox. Pt wanted to make sure that was safe during pregnancy. I spoke with Drenda Freeze, CNM and she advised it was safe. Pt voiced understanding. JSY

## 2013-07-20 NOTE — L&D Delivery Note (Signed)
Delivery Note At 4:29 PM a viable female was delivered via  (Presentation: ;  ).  APGAR:8 , 8; weight .   Placenta status:spont , . 3vc Cord:  with the following complications: none.  Cord pH: n/a  Anesthesia: Epidural  Episiotomy: none Lacerations: none Suture Repair: n/a Est. Blood Loss (mL):   Mom to postpartum.  Baby to Couplet care / Skin to Skin.  Koren Shiver DARLENE 01/07/2014, 4:55 PM

## 2013-08-07 ENCOUNTER — Ambulatory Visit (INDEPENDENT_AMBULATORY_CARE_PROVIDER_SITE_OTHER): Payer: Medicaid Other

## 2013-08-07 ENCOUNTER — Other Ambulatory Visit: Payer: Self-pay | Admitting: Advanced Practice Midwife

## 2013-08-07 DIAGNOSIS — O99320 Drug use complicating pregnancy, unspecified trimester: Secondary | ICD-10-CM

## 2013-08-07 DIAGNOSIS — Z3480 Encounter for supervision of other normal pregnancy, unspecified trimester: Secondary | ICD-10-CM

## 2013-08-07 DIAGNOSIS — O09299 Supervision of pregnancy with other poor reproductive or obstetric history, unspecified trimester: Secondary | ICD-10-CM

## 2013-08-07 DIAGNOSIS — F1920 Other psychoactive substance dependence, uncomplicated: Secondary | ICD-10-CM

## 2013-08-07 NOTE — Progress Notes (Signed)
U/S(19+1wks)-active fetus, meas c/w dates, fluid wnl, posterior Gr 0 placenta, cx appears closed (3.9cm), bilateral adnexa wnl, FHR- 141bpm, female fetus, no obvious abnl noted

## 2013-08-15 ENCOUNTER — Encounter: Payer: Self-pay | Admitting: Advanced Practice Midwife

## 2013-08-15 ENCOUNTER — Ambulatory Visit (INDEPENDENT_AMBULATORY_CARE_PROVIDER_SITE_OTHER): Payer: Medicaid Other | Admitting: Advanced Practice Midwife

## 2013-08-15 VITALS — BP 104/58 | Wt 173.0 lb

## 2013-08-15 DIAGNOSIS — Z3490 Encounter for supervision of normal pregnancy, unspecified, unspecified trimester: Secondary | ICD-10-CM

## 2013-08-15 DIAGNOSIS — O99340 Other mental disorders complicating pregnancy, unspecified trimester: Secondary | ICD-10-CM

## 2013-08-15 DIAGNOSIS — Z331 Pregnant state, incidental: Secondary | ICD-10-CM

## 2013-08-15 DIAGNOSIS — Z1389 Encounter for screening for other disorder: Secondary | ICD-10-CM

## 2013-08-15 DIAGNOSIS — O09299 Supervision of pregnancy with other poor reproductive or obstetric history, unspecified trimester: Secondary | ICD-10-CM

## 2013-08-15 LAB — POCT URINALYSIS DIPSTICK
Glucose, UA: NEGATIVE
Ketones, UA: NEGATIVE
Leukocytes, UA: NEGATIVE
Nitrite, UA: NEGATIVE
PROTEIN UA: NEGATIVE
RBC UA: NEGATIVE

## 2013-08-15 MED ORDER — PRAMOXINE HCL 1 % RE FOAM: 1.0000 "application " | Freq: Three times a day (TID) | RECTAL | Status: DC | PRN

## 2013-08-15 NOTE — Progress Notes (Signed)
Had normal anatomy scan.,  C/O rectal bleeding, sometimes not with BM's.  Small external hemorrhoid, non thrombosed.  Fiber rich diet, fluids, proctofoam.  If not improved in 1-2 weeks, will schedule GI consults.  F/U 4 weeks LROB

## 2013-09-12 ENCOUNTER — Encounter: Payer: Medicaid Other | Admitting: Advanced Practice Midwife

## 2013-09-18 ENCOUNTER — Ambulatory Visit (INDEPENDENT_AMBULATORY_CARE_PROVIDER_SITE_OTHER): Payer: Medicaid Other | Admitting: Women's Health

## 2013-09-18 ENCOUNTER — Encounter: Payer: Self-pay | Admitting: Women's Health

## 2013-09-18 VITALS — BP 92/48 | Wt 181.5 lb

## 2013-09-18 DIAGNOSIS — Z3480 Encounter for supervision of other normal pregnancy, unspecified trimester: Secondary | ICD-10-CM

## 2013-09-18 DIAGNOSIS — Z331 Pregnant state, incidental: Secondary | ICD-10-CM

## 2013-09-18 DIAGNOSIS — O99340 Other mental disorders complicating pregnancy, unspecified trimester: Secondary | ICD-10-CM

## 2013-09-18 DIAGNOSIS — Z1389 Encounter for screening for other disorder: Secondary | ICD-10-CM

## 2013-09-18 DIAGNOSIS — O09299 Supervision of pregnancy with other poor reproductive or obstetric history, unspecified trimester: Secondary | ICD-10-CM | POA: Insufficient documentation

## 2013-09-18 LAB — POCT URINALYSIS DIPSTICK
Blood, UA: NEGATIVE
Glucose, UA: NEGATIVE
KETONES UA: NEGATIVE
Leukocytes, UA: NEGATIVE
Nitrite, UA: NEGATIVE
PROTEIN UA: NEGATIVE

## 2013-09-18 NOTE — Progress Notes (Signed)
Original appt last week rescheduled d/t snow. Reports good fm. Denies uc's, lof, vb, uti s/s.  SOB x few months, has h/o asthma in childhood. Has inhaler at home, was unsure if she could use. Denies wheezing. HRRR, LCTAB. Can use inhaler if needed.  Reviewed ptl s/s, fm.  All questions answered. F/U in 3wks for pn2 and visit.

## 2013-09-18 NOTE — Patient Instructions (Signed)
You will have your sugar test next visit.  Please do not eat or drink anything after midnight the night before you come, not even water.  You will be here for at least two hours.     Second Trimester of Pregnancy The second trimester is from week 13 through week 28, months 4 through 6. The second trimester is often a time when you feel your best. Your body has also adjusted to being pregnant, and you begin to feel better physically. Usually, morning sickness has lessened or quit completely, you may have more energy, and you may have an increase in appetite. The second trimester is also a time when the fetus is growing rapidly. At the end of the sixth month, the fetus is about 9 inches long and weighs about 1 pounds. You will likely begin to feel the baby move (quickening) between 18 and 20 weeks of the pregnancy. BODY CHANGES Your body goes through many changes during pregnancy. The changes vary from woman to woman.   Your weight will continue to increase. You will notice your lower abdomen bulging out.  You may begin to get stretch marks on your hips, abdomen, and breasts.  You may develop headaches that can be relieved by medicines approved by your caregiver.  You may urinate more often because the fetus is pressing on your bladder.  You may develop or continue to have heartburn as a result of your pregnancy.  You may develop constipation because certain hormones are causing the muscles that push waste through your intestines to slow down.  You may develop hemorrhoids or swollen, bulging veins (varicose veins).  You may have back pain because of the weight gain and pregnancy hormones relaxing your joints between the bones in your pelvis and as a result of a shift in weight and the muscles that support your balance.  Your breasts will continue to grow and be tender.  Your gums may bleed and may be sensitive to brushing and flossing.  Dark spots or blotches (chloasma, mask of pregnancy)  may develop on your face. This will likely fade after the baby is born.  A dark line from your belly button to the pubic area (linea nigra) may appear. This will likely fade after the baby is born. WHAT TO EXPECT AT YOUR PRENATAL VISITS During a routine prenatal visit:  You will be weighed to make sure you and the fetus are growing normally.  Your blood pressure will be taken.  Your abdomen will be measured to track your baby's growth.  The fetal heartbeat will be listened to.  Any test results from the previous visit will be discussed. Your caregiver may ask you:  How you are feeling.  If you are feeling the baby move.  If you have had any abnormal symptoms, such as leaking fluid, bleeding, severe headaches, or abdominal cramping.  If you have any questions. Other tests that may be performed during your second trimester include:  Blood tests that check for:  Low iron levels (anemia).  Gestational diabetes (between 24 and 28 weeks).  Rh antibodies.  Urine tests to check for infections, diabetes, or protein in the urine.  An ultrasound to confirm the proper growth and development of the baby.  An amniocentesis to check for possible genetic problems.  Fetal screens for spina bifida and Down syndrome. HOME CARE INSTRUCTIONS   Avoid all smoking, herbs, alcohol, and unprescribed drugs. These chemicals affect the formation and growth of the baby.  Follow your caregiver's  instructions regarding medicine use. There are medicines that are either safe or unsafe to take during pregnancy.  Exercise only as directed by your caregiver. Experiencing uterine cramps is a good sign to stop exercising.  Continue to eat regular, healthy meals.  Wear a good support bra for breast tenderness.  Do not use hot tubs, steam rooms, or saunas.  Wear your seat belt at all times when driving.  Avoid raw meat, uncooked cheese, cat litter boxes, and soil used by cats. These carry germs that  can cause birth defects in the baby.  Take your prenatal vitamins.  Try taking a stool softener (if your caregiver approves) if you develop constipation. Eat more high-fiber foods, such as fresh vegetables or fruit and whole grains. Drink plenty of fluids to keep your urine clear or pale yellow.  Take warm sitz baths to soothe any pain or discomfort caused by hemorrhoids. Use hemorrhoid cream if your caregiver approves.  If you develop varicose veins, wear support hose. Elevate your feet for 15 minutes, 3 4 times a day. Limit salt in your diet.  Avoid heavy lifting, wear low heel shoes, and practice good posture.  Rest with your legs elevated if you have leg cramps or low back pain.  Visit your dentist if you have not gone yet during your pregnancy. Use a soft toothbrush to brush your teeth and be gentle when you floss.  A sexual relationship may be continued unless your caregiver directs you otherwise.  Continue to go to all your prenatal visits as directed by your caregiver. SEEK MEDICAL CARE IF:   You have dizziness.  You have mild pelvic cramps, pelvic pressure, or nagging pain in the abdominal area.  You have persistent nausea, vomiting, or diarrhea.  You have a bad smelling vaginal discharge.  You have pain with urination. SEEK IMMEDIATE MEDICAL CARE IF:   You have a fever.  You are leaking fluid from your vagina.  You have spotting or bleeding from your vagina.  You have severe abdominal cramping or pain.  You have rapid weight gain or loss.  You have shortness of breath with chest pain.  You notice sudden or extreme swelling of your face, hands, ankles, feet, or legs.  You have not felt your baby move in over an hour.  You have severe headaches that do not go away with medicine.  You have vision changes. Document Released: 06/30/2001 Document Revised: 03/08/2013 Document Reviewed: 09/06/2012 [facility] Patient Information 2014 [place].  Asthma  Attack Prevention Although there is no way to prevent asthma from starting, you can take steps to control the disease and reduce its symptoms. Learn about your asthma and how to control it. Take an active role to control your asthma by working with your health care provider to create and follow an asthma action plan. An asthma action plan guides you in:  Taking your medicines properly.  Avoiding things that set off your asthma or make your asthma worse (asthma triggers).  Tracking your level of asthma control.  Responding to worsening asthma.  Seeking emergency care when needed. To track your asthma, keep records of your symptoms, check your peak flow number using a handheld device that shows how well air moves out of your lungs (peak flow meter), and get regular asthma checkups.  WHAT ARE SOME WAYS TO PREVENT AN ASTHMA ATTACK?  Take medicines as directed by your health care provider.  Keep track of your asthma symptoms and level of control.  With your  health care provider, write a detailed plan for taking medicines and managing an asthma attack. Then be sure to follow your action plan. Asthma is an ongoing condition that needs regular monitoring and treatment.  Identify and avoid asthma triggers. Many outdoor allergens and irritants (such as pollen, mold, cold air, and air pollution) can trigger asthma attacks. Find out what your asthma triggers are and take steps to avoid them.  Monitor your breathing. Learn to recognize warning signs of an attack, such as coughing, wheezing, or shortness of breath. Your lung function may decrease before you notice any signs or symptoms, so regularly measure and record your peak airflow with a home peak flow meter.  Identify and treat attacks early. If you act quickly, you are less likely to have a severe attack. You will also need less medicine to control your symptoms. When your peak flow measurements decrease and alert you to an upcoming attack, take  your medicine as instructed and immediately stop any activity that may have triggered the attack. If your symptoms do not improve, get medical help.  Pay attention to increasing quick-relief inhaler use. If you find yourself relying on your quick-relief inhaler, your asthma is not under control. See your health care provider about adjusting your treatment. WHAT CAN MAKE MY SYMPTOMS WORSE? A number of common things can set off or make your asthma symptoms worse and cause temporary increased inflammation of your airways. Keep track of your asthma symptoms for several weeks, detailing all the environmental and emotional factors that are linked with your asthma. When you have an asthma attack, go back to your asthma diary to see which factor, or combination of factors, might have contributed to it. Once you know what these factors are, you can take steps to control many of them. If you have allergies and asthma, it is important to take asthma prevention steps at home. Minimizing contact with the substance to which you are allergic will help prevent an asthma attack. Some triggers and ways to avoid these triggers are: Animal Dander:  Some people are allergic to the flakes of skin or dried saliva from animals with fur or feathers.   There is no such thing as a hypoallergenic dog or cat breed. All dogs or cats can cause allergies, even if they don't shed.  Keep these pets out of your home.  If you are not able to keep a pet outdoors, keep the pet out of your bedroom and other sleeping areas at all times, and keep the door closed.  Remove carpets and furniture covered with cloth from your home. If that is not possible, keep the pet away from fabric-covered furniture and carpets. Dust Mites: Many people with asthma are allergic to dust mites. Dust mites are tiny bugs that are found in every home in mattresses, pillows, carpets, fabric-covered furniture, bedcovers, clothes, stuffed toys, and other  fabric-covered items.   Cover your mattress in a special dust-proof cover.  Cover your pillow in a special dust-proof cover, or wash the pillow each week in hot water. Water must be hotter than 130 F (54.4 C) to kill dust mites. Cold or warm water used with detergent and bleach can also be effective.  Wash the sheets and blankets on your bed each week in hot water.  Try not to sleep or lie on cloth-covered cushions.  Call ahead when traveling and ask for a smoke-free hotel room. Bring your own bedding and pillows in case the hotel only supplies feather pillows  and down comforters, which may contain dust mites and cause asthma symptoms.  Remove carpets from your bedroom and those laid on concrete, if you can.  Keep stuffed toys out of the bed, or wash the toys weekly in hot water or cooler water with detergent and bleach. Cockroaches: Many people with asthma are allergic to the droppings and remains of cockroaches.   Keep food and garbage in closed containers. Never leave food out.  Use poison baits, traps, powders, gels, or paste (for example, boric acid).  If a spray is used to kill cockroaches, stay out of the room until the odor goes away. Indoor Mold:  Fix leaky faucets, pipes, or other sources of water that have mold around them.  Clean floors and moldy surfaces with a fungicide or diluted bleach.  Avoid using humidifiers, vaporizers, or swamp coolers. These can spread molds through the air. Pollen and Outdoor Mold:  When pollen or mold spore counts are high, try to keep your windows closed.  Stay indoors with windows closed from late morning to afternoon. Pollen and some mold spore counts are highest at that time.  Ask your health care provider whether you need to take anti-inflammatory medicine or increase your dose of the medicine before your allergy season starts. Other Irritants to Avoid:  Tobacco smoke is an irritant. If you smoke, ask your health care provider how  you can quit. Ask family members to quit smoking too. Do not allow smoking in your home or car.  If possible, do not use a wood-burning stove, kerosene heater, or fireplace. Minimize exposure to all sources of smoke, including to incense, candles, fires, and fireworks.  Try to stay away from strong odors and sprays, such as perfume, talcum powder, hair spray, and paints.  Decrease humidity in your home and use an indoor air cleaning device. Reduce indoor humidity to below 60%. Dehumidifiers or central air conditioners can do this.  Decrease house dust exposure by changing furnace and air cooler filters frequently.  Try to have someone else vacuum for you once or twice a week. Stay out of rooms while they are being vacuumed and for a short while afterward.  If you vacuum, use a dust mask from a hardware store, a double-layered or microfilter vacuum cleaner bag, or a vacuum cleaner with a HEPA filter.  Sulfites in foods and beverages can be irritants. Do not drink beer or wine or eat dried fruit, processed potatoes, or shrimp if they cause asthma symptoms.  Cold air can trigger an asthma attack. Cover your nose and mouth with a scarf on cold or windy days.  Several health conditions can make asthma more difficult to manage, including a runny nose, sinus infections, reflux disease, psychological stress, and sleep apnea. Work with your health care provider to manage these conditions.  Avoid close contact with people who have a respiratory infection such as a cold or the flu, since your asthma symptoms may get worse if you catch the infection. Wash your hands thoroughly after touching items that may have been handled by people with a respiratory infection.  Get a flu shot every year to protect against the flu virus, which often makes asthma worse for days or weeks. Also get a pneumonia shot if you have not previously had one. Unlike the flu shot, the pneumonia shot does not need to be given  yearly. Medicines:  Talk to your health care provider about whether it is safe for you to take aspirin or non-steroidal anti-inflammatory  medicines (NSAIDs). In a small number of people with asthma, aspirin and NSAIDs can cause asthma attacks. These medicines must be avoided by people who have known aspirin-sensitive asthma. It is important that people with aspirin-sensitive asthma read labels of all over-the-counter medicines used to treat pain, colds, coughs, and fever.  Beta blockers and ACE inhibitors are other medicines you should discuss with your health care provider. HOW CAN I FIND OUT WHAT I AM ALLERGIC TO? Ask your asthma health care provider about allergy skin testing or blood testing (the RAST test) to identify the allergens to which you are sensitive. If you are found to have allergies, the most important thing to do is to try to avoid exposure to any allergens that you are sensitive to as much as possible. Other treatments for allergies, such as medicines and allergy shots (immunotherapy) are available.  CAN I EXERCISE? Follow your health care provider's advice regarding asthma treatment before exercising. It is important to maintain a regular exercise program, but vigorous exercise, or exercise in cold, humid, or dry environments can cause asthma attacks, especially for those people who have exercise-induced asthma. Document Released: 06/24/2009 Document Revised: 03/08/2013 Document Reviewed: 01/11/2013 [facility] Patient Information 2014 [place].

## 2013-10-09 ENCOUNTER — Other Ambulatory Visit: Payer: Medicaid Other

## 2013-10-09 ENCOUNTER — Encounter: Payer: Medicaid Other | Admitting: Women's Health

## 2013-11-09 ENCOUNTER — Encounter: Payer: Self-pay | Admitting: Obstetrics & Gynecology

## 2013-11-09 ENCOUNTER — Ambulatory Visit (INDEPENDENT_AMBULATORY_CARE_PROVIDER_SITE_OTHER): Payer: Medicaid Other | Admitting: Obstetrics & Gynecology

## 2013-11-09 VITALS — BP 100/60 | Wt 186.0 lb

## 2013-11-09 DIAGNOSIS — O99340 Other mental disorders complicating pregnancy, unspecified trimester: Secondary | ICD-10-CM

## 2013-11-09 DIAGNOSIS — Z3480 Encounter for supervision of other normal pregnancy, unspecified trimester: Secondary | ICD-10-CM

## 2013-11-09 DIAGNOSIS — Z331 Pregnant state, incidental: Secondary | ICD-10-CM

## 2013-11-09 DIAGNOSIS — Z1389 Encounter for screening for other disorder: Secondary | ICD-10-CM

## 2013-11-09 DIAGNOSIS — O09299 Supervision of pregnancy with other poor reproductive or obstetric history, unspecified trimester: Secondary | ICD-10-CM

## 2013-11-09 LAB — POCT URINALYSIS DIPSTICK
Glucose, UA: NEGATIVE
KETONES UA: NEGATIVE
Leukocytes, UA: NEGATIVE
Nitrite, UA: NEGATIVE
Protein, UA: NEGATIVE
RBC UA: NEGATIVE

## 2013-11-09 NOTE — Progress Notes (Signed)
Pt with congestion probably from allergies: OTC claritin, alka selter plus day/night, delsyn cough syrup  BP weight and urine results all reviewed and noted. Patient reports good fetal movement, denies any bleeding and no rupture of membranes symptoms or regular contractions. Patient is without complaints. All questions were answered.

## 2013-11-09 NOTE — Addendum Note (Signed)
Addended by: Traci Sermon A on: 11/09/2013 10:26 AM   Modules accepted: Orders

## 2013-11-16 ENCOUNTER — Other Ambulatory Visit: Payer: Medicaid Other

## 2013-11-16 ENCOUNTER — Encounter: Payer: Self-pay | Admitting: Advanced Practice Midwife

## 2013-11-16 ENCOUNTER — Ambulatory Visit (INDEPENDENT_AMBULATORY_CARE_PROVIDER_SITE_OTHER): Payer: Medicaid Other | Admitting: Advanced Practice Midwife

## 2013-11-16 VITALS — BP 90/58 | Wt 188.0 lb

## 2013-11-16 DIAGNOSIS — O99340 Other mental disorders complicating pregnancy, unspecified trimester: Secondary | ICD-10-CM

## 2013-11-16 DIAGNOSIS — O09299 Supervision of pregnancy with other poor reproductive or obstetric history, unspecified trimester: Secondary | ICD-10-CM

## 2013-11-16 DIAGNOSIS — O99019 Anemia complicating pregnancy, unspecified trimester: Secondary | ICD-10-CM

## 2013-11-16 DIAGNOSIS — F5089 Other specified eating disorder: Secondary | ICD-10-CM | POA: Insufficient documentation

## 2013-11-16 DIAGNOSIS — Z1389 Encounter for screening for other disorder: Secondary | ICD-10-CM

## 2013-11-16 DIAGNOSIS — Z331 Pregnant state, incidental: Secondary | ICD-10-CM

## 2013-11-16 DIAGNOSIS — Z3480 Encounter for supervision of other normal pregnancy, unspecified trimester: Secondary | ICD-10-CM

## 2013-11-16 LAB — CBC
HEMATOCRIT: 31.3 % — AB (ref 36.0–46.0)
HEMOGLOBIN: 10.2 g/dL — AB (ref 12.0–15.0)
MCH: 24.5 pg — AB (ref 26.0–34.0)
MCHC: 32.6 g/dL (ref 30.0–36.0)
MCV: 75.2 fL — ABNORMAL LOW (ref 78.0–100.0)
Platelets: 286 10*3/uL (ref 150–400)
RBC: 4.16 MIL/uL (ref 3.87–5.11)
RDW: 14.1 % (ref 11.5–15.5)
WBC: 16 10*3/uL — ABNORMAL HIGH (ref 4.0–10.5)

## 2013-11-16 LAB — POCT URINALYSIS DIPSTICK
Blood, UA: NEGATIVE
Glucose, UA: NEGATIVE
Ketones, UA: NEGATIVE
LEUKOCYTES UA: NEGATIVE
NITRITE UA: NEGATIVE
Protein, UA: NEGATIVE

## 2013-11-16 NOTE — Progress Notes (Signed)
Pt denies any problems or concerns at this time.  
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
## 2013-11-17 LAB — GLUCOSE TOLERANCE, 2 HOURS W/ 1HR
GLUCOSE, 2 HOUR: 91 mg/dL (ref 70–139)
Glucose, 1 hour: 137 mg/dL (ref 70–170)
Glucose, Fasting: 80 mg/dL (ref 70–99)

## 2013-11-17 LAB — HIV ANTIBODY (ROUTINE TESTING W REFLEX): HIV: NONREACTIVE

## 2013-11-17 LAB — RPR

## 2013-11-17 LAB — ANTIBODY SCREEN: Antibody Screen: NEGATIVE

## 2013-11-17 LAB — HSV 2 ANTIBODY, IGG: HSV 2 GLYCOPROTEIN G AB, IGG: 2.67 IV — AB

## 2013-11-21 ENCOUNTER — Telehealth: Payer: Self-pay

## 2013-11-21 ENCOUNTER — Inpatient Hospital Stay (HOSPITAL_COMMUNITY)
Admission: AD | Admit: 2013-11-21 | Discharge: 2013-11-22 | Disposition: A | Discharge status: Home / Self Care | Payer: Medicaid Other | Source: Ambulatory Visit | Attending: Obstetrics & Gynecology | Admitting: Obstetrics & Gynecology

## 2013-11-21 ENCOUNTER — Encounter (HOSPITAL_COMMUNITY): Payer: Self-pay

## 2013-11-21 DIAGNOSIS — Y9229 Other specified public building as the place of occurrence of the external cause: Secondary | ICD-10-CM | POA: Insufficient documentation

## 2013-11-21 DIAGNOSIS — O09299 Supervision of pregnancy with other poor reproductive or obstetric history, unspecified trimester: Secondary | ICD-10-CM

## 2013-11-21 DIAGNOSIS — F5089 Other specified eating disorder: Secondary | ICD-10-CM

## 2013-11-21 DIAGNOSIS — Z3480 Encounter for supervision of other normal pregnancy, unspecified trimester: Secondary | ICD-10-CM

## 2013-11-21 DIAGNOSIS — W19XXXA Unspecified fall, initial encounter: Secondary | ICD-10-CM

## 2013-11-21 DIAGNOSIS — Y9301 Activity, walking, marching and hiking: Secondary | ICD-10-CM | POA: Insufficient documentation

## 2013-11-21 DIAGNOSIS — M549 Dorsalgia, unspecified: Secondary | ICD-10-CM | POA: Insufficient documentation

## 2013-11-21 DIAGNOSIS — O99891 Other specified diseases and conditions complicating pregnancy: Secondary | ICD-10-CM

## 2013-11-21 DIAGNOSIS — O9989 Other specified diseases and conditions complicating pregnancy, childbirth and the puerperium: Secondary | ICD-10-CM

## 2013-11-21 DIAGNOSIS — O368190 Decreased fetal movements, unspecified trimester, not applicable or unspecified: Secondary | ICD-10-CM | POA: Insufficient documentation

## 2013-11-21 DIAGNOSIS — O99330 Smoking (tobacco) complicating pregnancy, unspecified trimester: Secondary | ICD-10-CM | POA: Insufficient documentation

## 2013-11-21 DIAGNOSIS — W010XXA Fall on same level from slipping, tripping and stumbling without subsequent striking against object, initial encounter: Secondary | ICD-10-CM | POA: Insufficient documentation

## 2013-11-21 LAB — URINALYSIS, ROUTINE W REFLEX MICROSCOPIC
Glucose, UA: 500 mg/dL — AB
Hgb urine dipstick: NEGATIVE
Ketones, ur: NEGATIVE mg/dL
Leukocytes, UA: NEGATIVE
NITRITE: NEGATIVE
PROTEIN: 30 mg/dL — AB
UROBILINOGEN UA: 1 mg/dL (ref 0.0–1.0)
pH: 5.5 (ref 5.0–8.0)

## 2013-11-21 LAB — URINE MICROSCOPIC-ADD ON

## 2013-11-21 NOTE — MAU Note (Signed)
Pt fell last night, landed on buttocks.  Decreased fetal movement today, feeling tired and irritable. Denies vag bleeding. Feeling tightening several times today.

## 2013-11-22 DIAGNOSIS — O368190 Decreased fetal movements, unspecified trimester, not applicable or unspecified: Secondary | ICD-10-CM

## 2013-11-22 MED ORDER — CYCLOBENZAPRINE HCL 10 MG PO TABS: 10.0000 mg | ORAL_TABLET | Freq: Three times a day (TID) | ORAL | Status: DC | PRN

## 2013-11-22 NOTE — MAU Provider Note (Signed)
Attestation of Attending Supervision of Advanced Practitioner (CNM/NP): Evaluation and management procedures were performed by the Advanced Practitioner under my supervision and collaboration.  I have reviewed the Advanced Practitioner's note and chart, and I agree with the management and plan.  Loann Chahal Harraway-Smith 2:18 AM

## 2013-11-22 NOTE — Discharge Instructions (Signed)
Fall Prevention and Home Safety Falls cause injuries and can affect all age groups. It is possible to use preventive measures to significantly decrease the likelihood of falls. There are many simple measures which can make your home safer and prevent falls. OUTDOORS  Repair cracks and edges of walkways and driveways.  Remove high doorway thresholds.  Trim shrubbery on the main path into your home.  Have good outside lighting.  Clear walkways of tools, rocks, debris, and clutter.  Check that handrails are not broken and are securely fastened. Both sides of steps should have handrails.  Have leaves, snow, and ice cleared regularly.  Use sand or salt on walkways during winter months.  In the garage, clean up grease or oil spills. BATHROOM  Install night lights.  Install grab bars by the toilet and in the tub and shower.  Use non-skid mats or decals in the tub or shower.  Place a plastic non-slip stool in the shower to sit on, if needed.  Keep floors dry and clean up all water on the floor immediately.  Remove soap buildup in the tub or shower on a regular basis.  Secure bath mats with non-slip, double-sided rug tape.  Remove throw rugs and tripping hazards from the floors. BEDROOMS  Install night lights.  Make sure a bedside light is easy to reach.  Do not use oversized bedding.  Keep a telephone by your bedside.  Have a firm chair with side arms to use for getting dressed.  Remove throw rugs and tripping hazards from the floor. KITCHEN  Keep handles on pots and pans turned toward the center of the stove. Use back burners when possible.  Clean up spills quickly and allow time for drying.  Avoid walking on wet floors.  Avoid hot utensils and knives.  Position shelves so they are not too high or low.  Place commonly used objects within easy reach.  If necessary, use a sturdy step stool with a grab bar when reaching.  Keep electrical cables out of the  way.  Do not use floor polish or wax that makes floors slippery. If you must use wax, use non-skid floor wax.  Remove throw rugs and tripping hazards from the floor. STAIRWAYS  Never leave objects on stairs.  Place handrails on both sides of stairways and use them. Fix any loose handrails. Make sure handrails on both sides of the stairways are as long as the stairs.  Check carpeting to make sure it is firmly attached along stairs. Make repairs to worn or loose carpet promptly.  Avoid placing throw rugs at the top or bottom of stairways, or properly secure the rug with carpet tape to prevent slippage. Get rid of throw rugs, if possible.  Have an electrician put in a light switch at the top and bottom of the stairs. OTHER FALL PREVENTION TIPS  Wear low-heel or rubber-soled shoes that are supportive and fit well. Wear closed toe shoes.  When using a stepladder, make sure it is fully opened and both spreaders are firmly locked. Do not climb a closed stepladder.  Add color or contrast paint or tape to grab bars and handrails in your home. Place contrasting color strips on first and last steps.  Learn and use mobility aids as needed. Install an electrical emergency response system.  Turn on lights to avoid dark areas. Replace light bulbs that burn out immediately. Get light switches that glow.  Arrange furniture to create clear pathways. Keep furniture in the same place.  Firmly attach carpet with non-skid or double-sided tape.  Eliminate uneven floor surfaces.  Select a carpet pattern that does not visually hide the edge of steps.  Be aware of all pets. OTHER HOME SAFETY TIPS  Set the water temperature for 120 F (48.8 C).  Keep emergency numbers on or near the telephone.  Keep smoke detectors on every level of the home and near sleeping areas. Document Released: 06/26/2002 Document Revised: 01/05/2012 Document Reviewed: 09/25/2011 [facility] Patient Information 2014  [place].  Fetal Movement Counts Patient Name: __________________________________________________ Patient Due Date: ____________________ Performing a fetal movement count is highly recommended in high-risk pregnancies, but it is good for every pregnant woman to do. Your caregiver may ask you to start counting fetal movements at 28 weeks of the pregnancy. Fetal movements often increase:  After eating a full meal.  After physical activity.  After eating or drinking something sweet or cold.  At rest. Pay attention to when you feel the baby is most active. This will help you notice a pattern of your baby's sleep and wake cycles and what factors contribute to an increase in fetal movement. It is important to perform a fetal movement count at the same time each day when your baby is normally most active.  HOW TO COUNT FETAL MOVEMENTS 1. Find a quiet and comfortable area to sit or lie down on your left side. Lying on your left side provides the best blood and oxygen circulation to your baby. 2. Write down the day and time on a sheet of paper or in a journal. 3. Start counting kicks, flutters, swishes, rolls, or jabs in a 2 hour period. You should feel at least 10 movements within 2 hours. 4. If you do not feel 10 movements in 2 hours, wait 2 3 hours and count again. Look for a change in the pattern or not enough counts in 2 hours. SEEK MEDICAL CARE IF:  You feel less than 10 counts in 2 hours, tried twice.  There is no movement in over an hour.  The pattern is changing or taking longer each day to reach 10 counts in 2 hours.  You feel the baby is not moving as he or she usually does. Date: ____________ Movements: ____________ Start time: ____________ Elizebeth Koller time: ____________  Date: ____________ Movements: ____________ Start time: ____________ Elizebeth Koller time: ____________ Date: ____________ Movements: ____________ Start time: ____________ Elizebeth Koller time: ____________ Date: ____________  Movements: ____________ Start time: ____________ Elizebeth Koller time: ____________ Date: ____________ Movements: ____________ Start time: ____________ Elizebeth Koller time: ____________ Date: ____________ Movements: ____________ Start time: ____________ Elizebeth Koller time: ____________ Date: ____________ Movements: ____________ Start time: ____________ Elizebeth Koller time: ____________ Date: ____________ Movements: ____________ Start time: ____________ Elizebeth Koller time: ____________  Date: ____________ Movements: ____________ Start time: ____________ Elizebeth Koller time: ____________ Date: ____________ Movements: ____________ Start time: ____________ Elizebeth Koller time: ____________ Date: ____________ Movements: ____________ Start time: ____________ Elizebeth Koller time: ____________ Date: ____________ Movements: ____________ Start time: ____________ Elizebeth Koller time: ____________ Date: ____________ Movements: ____________ Start time: ____________ Elizebeth Koller time: ____________ Date: ____________ Movements: ____________ Start time: ____________ Elizebeth Koller time: ____________ Date: ____________ Movements: ____________ Start time: ____________ Elizebeth Koller time: ____________  Date: ____________ Movements: ____________ Start time: ____________ Elizebeth Koller time: ____________ Date: ____________ Movements: ____________ Start time: ____________ Elizebeth Koller time: ____________ Date: ____________ Movements: ____________ Start time: ____________ Elizebeth Koller time: ____________ Date: ____________ Movements: ____________ Start time: ____________ Elizebeth Koller time: ____________ Date: ____________ Movements: ____________ Start time: ____________ Elizebeth Koller time: ____________ Date: ____________ Movements: ____________ Start time: ____________ Elizebeth Koller time: ____________ Date: ____________ Movements: ____________  Start time: ____________ Elizebeth Koller time: ____________  Date: ____________ Movements: ____________ Start time: ____________ Elizebeth Koller time: ____________ Date: ____________ Movements: ____________ Start time:  ____________ Elizebeth Koller time: ____________ Date: ____________ Movements: ____________ Start time: ____________ Elizebeth Koller time: ____________ Date: ____________ Movements: ____________ Start time: ____________ Elizebeth Koller time: ____________ Date: ____________ Movements: ____________ Start time: ____________ Elizebeth Koller time: ____________ Date: ____________ Movements: ____________ Start time: ____________ Elizebeth Koller time: ____________ Date: ____________ Movements: ____________ Start time: ____________ Elizebeth Koller time: ____________  Date: ____________ Movements: ____________ Start time: ____________ Elizebeth Koller time: ____________ Date: ____________ Movements: ____________ Start time: ____________ Elizebeth Koller time: ____________ Date: ____________ Movements: ____________ Start time: ____________ Elizebeth Koller time: ____________ Date: ____________ Movements: ____________ Start time: ____________ Elizebeth Koller time: ____________ Date: ____________ Movements: ____________ Start time: ____________ Elizebeth Koller time: ____________ Date: ____________ Movements: ____________ Start time: ____________ Elizebeth Koller time: ____________ Date: ____________ Movements: ____________ Start time: ____________ Elizebeth Koller time: ____________  Date: ____________ Movements: ____________ Start time: ____________ Elizebeth Koller time: ____________ Date: ____________ Movements: ____________ Start time: ____________ Elizebeth Koller time: ____________ Date: ____________ Movements: ____________ Start time: ____________ Elizebeth Koller time: ____________ Date: ____________ Movements: ____________ Start time: ____________ Elizebeth Koller time: ____________ Date: ____________ Movements: ____________ Start time: ____________ Elizebeth Koller time: ____________ Date: ____________ Movements: ____________ Start time: ____________ Elizebeth Koller time: ____________ Date: ____________ Movements: ____________ Start time: ____________ Elizebeth Koller time: ____________  Date: ____________ Movements: ____________ Start time: ____________ Elizebeth Koller time: ____________ Date:  ____________ Movements: ____________ Start time: ____________ Elizebeth Koller time: ____________ Date: ____________ Movements: ____________ Start time: ____________ Elizebeth Koller time: ____________ Date: ____________ Movements: ____________ Start time: ____________ Elizebeth Koller time: ____________ Date: ____________ Movements: ____________ Start time: ____________ Elizebeth Koller time: ____________ Date: ____________ Movements: ____________ Start time: ____________ Elizebeth Koller time: ____________ Date: ____________ Movements: ____________ Start time: ____________ Elizebeth Koller time: ____________  Date: ____________ Movements: ____________ Start time: ____________ Elizebeth Koller time: ____________ Date: ____________ Movements: ____________ Start time: ____________ Elizebeth Koller time: ____________ Date: ____________ Movements: ____________ Start time: ____________ Elizebeth Koller time: ____________ Date: ____________ Movements: ____________ Start time: ____________ Elizebeth Koller time: ____________ Date: ____________ Movements: ____________ Start time: ____________ Elizebeth Koller time: ____________ Date: ____________ Movements: ____________ Start time: ____________ Elizebeth Koller time: ____________ Document Released: 08/05/2006 Document Revised: 06/22/2012 Document Reviewed: 05/02/2012 [employer] Patient Information 2014 [employer].

## 2013-11-22 NOTE — Telephone Encounter (Signed)
Called and checked on the pt after being unable to reach her yesterday by phone. She stated that she had gone to [facility] for evaluation and that everything had checked out good. Pt was advised to call back if anything changes.

## 2013-11-22 NOTE — MAU Provider Note (Signed)
Chief Complaint:  Decreased Fetal Movement   None     HPI: [name] [name] is a 23 y.o. [phone] at [redacted]w[redacted]d pt of FT who presents to maternity admissions reporting a fall >24 hours ago.  She reports that on Monday evening she was at [employer] shopping for something for her car and an employee of the store told her the floor had been mopped by the registers but she did not know the floor was also wet by the exit door.  She started to slide as she approached the door, then grabbed the door but slipped down to the floor and landed on her buttocks.  She did not hit her abdomen during the fall.  She felt Ok initially, and went home.  This morning, she felt sore and had back pain and reports she hasn't felt the baby move as much as usual today.  Once arriving in MAU, she reports good fetal movement.  She denies LOF, vaginal bleeding, vaginal itching/burning, urinary symptoms, h/a, dizziness, n/v, or fever/chills.    Pt reports she drank juice on her way to MAU tonight.   Past Medical History: Past Medical History  Diagnosis Date  . Asthma   . ADHD   . Migraines     with aura (see flashes)    Past obstetric history: OB History  Gravida Para Term Preterm AB SAB TAB Ectopic Multiple Living  4 2 2  1 1    2     # Outcome Date GA Lbr Len/2nd Weight Sex Delivery Anes PTL Lv  4 CUR           3 TRM 07/19/11 [redacted]w[redacted]d 06:03 / 00:40 4.641 kg (10 lb 3.7 oz) M SVD EPI  Y     Comments: wnl  2 TRM 09/06/08 [redacted]w[redacted]d  4.026 kg (8 lb 14 oz) F SVD EPI  Y  1 SAB               Past Surgical History: Past Surgical History  Procedure Laterality Date  . Dilation and curettage of uterus    . Wisdom tooth extraction    . Jaw wired shut    . 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 Diabetes Mother   . Liver disease Mother     fatty liver   . Hypertension Father   . COPD Father   . Hypertension Brother   . COPD Brother     Social  History: History  Substance Use Topics  . Smoking status: Current Every Day Smoker -- 1.00 packs/day    Types: Cigarettes  . Smokeless tobacco: Never Used  . Alcohol Use: No    Allergies:  Allergies  Allergen Reactions  . Darvocet [Propoxyphene N-Acetaminophen] Other (See Comments)    Hallucinations - Dislocated her jaw during a vivid episode  . Imitrex [Sumatriptan Base]     Patient states that imitrex gives her chest pain  . Adhesive [Tape] Hives  . Codeine   . Percocet [Oxycodone-Acetaminophen] Other (See Comments)    "made me feel funny"   . Sulfa Antibiotics Swelling, Rash and Other (See Comments)    Eye drops at child -- caused pain, swelling, & redness.  Her doctor said it was an allergy.    Does not remember name of drug but had, "face" sound in its name.    Meds:  No prescriptions prior to admission    ROS: Pertinent findings in  history of present illness.  Physical Exam  Blood pressure 115/53, pulse 95, temperature 98.1 F (36.7 C), temperature source Oral, resp. rate 18, height 5' (1.524 m), weight 85.911 kg (189 lb 6.4 oz), last menstrual period 03/20/2013. GENERAL: Well-developed, well-nourished female in no acute distress.  No bruising, lacerations noted on visual inspection  HEENT: normocephalic HEART: normal rate RESP: normal effort ABDOMEN: Soft, non-tender, gravid appropriate for gestational age EXTREMITIES: Nontender, no edema NEURO: alert and oriented    FHT:  Baseline 125, moderate variability, accelerations present, no decelerations Contractions: None on toco or to palpation   Labs: Results for orders placed during the hospital encounter of 11/21/13 (from the past 24 hour(s))  URINALYSIS, ROUTINE W REFLEX MICROSCOPIC     Status: Abnormal   Collection Time    11/21/13 10:55 PM      Result Value Ref Range   Color, Urine YELLOW  YELLOW   APPearance CLEAR  CLEAR   Specific Gravity, Urine >1.030 (*) 1.005 - 1.030   pH 5.5  5.0 - 8.0    Glucose, UA 500 (*) NEGATIVE mg/dL   Hgb urine dipstick NEGATIVE  NEGATIVE   Bilirubin Urine SMALL (*) NEGATIVE   Ketones, ur NEGATIVE  NEGATIVE mg/dL   Protein, ur 30 (*) NEGATIVE mg/dL   Urobilinogen, UA 1.0  0.0 - 1.0 mg/dL   Nitrite NEGATIVE  NEGATIVE   Leukocytes, UA NEGATIVE  NEGATIVE  URINE MICROSCOPIC-ADD ON     Status: Abnormal   Collection Time    11/21/13 10:55 PM      Result Value Ref Range   Squamous Epithelial / LPF FEW (*) RARE   WBC, UA 0-2  <3 WBC/hpf   RBC / HPF 3-6  <3 RBC/hpf   Bacteria, UA FEW (*) RARE   Crystals CA OXALATE CRYSTALS (*) NEGATIVE    Assessment: 1. Pica   2. H/O shoulder dystocia in prior pregnancy, currently pregnant   3. Supervision of other normal pregnancy   4. Back pain complicating pregnancy   5. Fall     Plan: Discharge home  Fetal kick counts Increase water intake F/U as scheduled at [facility] Return to MAU as needed  Follow-up Information   Follow up with [facility]. (as scheduled)    Contact information:   [place] [id number] [phone]       Medication List         albuterol 108 (90 BASE) MCG/ACT inhaler  Commonly known as:  PROVENTIL HFA;VENTOLIN HFA  Inhale into the lungs every 6 (six) hours as needed for wheezing or shortness of breath.     aspirin-sod bicarb-citric acid 325 MG Tbef tablet  Commonly known as:  ALKA-SELTZER  Take 325 mg by mouth every 6 (six) hours as needed.     cephALEXin 500 MG capsule  Commonly known as:  KEFLEX  Take 1 capsule (500 mg total) by mouth 3 (three) times daily.     cyclobenzaprine 10 MG tablet  Commonly known as:  FLEXERIL  Take 1 tablet (10 mg total) by mouth 3 (three) times daily as needed for muscle spasms.     FLINSTONES GUMMIES OMEGA-3 DHA PO  Take by mouth. Takes 2 daily     loratadine 10 MG tablet  Commonly known as:  CLARITIN  Take 10 mg by mouth daily.     multivitamin-prenatal 27-0.8 MG Tabs tablet  Take 1 tablet by mouth  daily at 12 noon.     pramoxine  1 % foam  Commonly known as:  PROCTOFOAM  Place 1 application rectally 3 (three) times daily as needed for itching.        Fatima Blank Certified Nurse-Midwife 11/22/2013 1:37 AM

## 2013-11-26 ENCOUNTER — Encounter: Payer: Self-pay | Admitting: Obstetrics & Gynecology

## 2013-11-26 DIAGNOSIS — R768 Other specified abnormal immunological findings in serum: Secondary | ICD-10-CM | POA: Insufficient documentation

## 2013-11-27 ENCOUNTER — Ambulatory Visit (INDEPENDENT_AMBULATORY_CARE_PROVIDER_SITE_OTHER): Payer: Medicaid Other | Admitting: Obstetrics and Gynecology

## 2013-11-27 ENCOUNTER — Telehealth: Payer: Self-pay | Admitting: Obstetrics and Gynecology

## 2013-11-27 ENCOUNTER — Encounter: Payer: Self-pay | Admitting: Obstetrics and Gynecology

## 2013-11-27 VITALS — BP 120/62 | Wt 190.0 lb

## 2013-11-27 DIAGNOSIS — O09299 Supervision of pregnancy with other poor reproductive or obstetric history, unspecified trimester: Secondary | ICD-10-CM

## 2013-11-27 DIAGNOSIS — Z131 Encounter for screening for diabetes mellitus: Secondary | ICD-10-CM

## 2013-11-27 DIAGNOSIS — O99340 Other mental disorders complicating pregnancy, unspecified trimester: Secondary | ICD-10-CM

## 2013-11-27 DIAGNOSIS — O99019 Anemia complicating pregnancy, unspecified trimester: Secondary | ICD-10-CM

## 2013-11-27 DIAGNOSIS — Z3480 Encounter for supervision of other normal pregnancy, unspecified trimester: Secondary | ICD-10-CM

## 2013-11-27 DIAGNOSIS — Z331 Pregnant state, incidental: Secondary | ICD-10-CM

## 2013-11-27 DIAGNOSIS — Z1389 Encounter for screening for other disorder: Secondary | ICD-10-CM

## 2013-11-27 DIAGNOSIS — R81 Glycosuria: Secondary | ICD-10-CM

## 2013-11-27 DIAGNOSIS — F5089 Other specified eating disorder: Secondary | ICD-10-CM

## 2013-11-27 LAB — POCT URINALYSIS DIPSTICK
GLUCOSE UA: NEGATIVE
Leukocytes, UA: NEGATIVE
NITRITE UA: NEGATIVE
RBC UA: NEGATIVE

## 2013-11-27 LAB — GLUCOSE, POCT (MANUAL RESULT ENTRY): POC GLUCOSE: 77 mg/dL (ref 70–99)

## 2013-11-27 NOTE — Patient Instructions (Signed)
Fetal Movement Counts Patient Name: __________________________________________________ Patient Due Date: ____________________ Performing a fetal movement count is highly recommended in high-risk pregnancies, but it is good for every pregnant woman to do. Your caregiver may ask you to start counting fetal movements at 28 weeks of the pregnancy. Fetal movements often increase:  After eating a full meal.  After physical activity.  After eating or drinking something sweet or cold.  At rest. Pay attention to when you feel the baby is most active. This will help you notice a pattern of your baby's sleep and wake cycles and what factors contribute to an increase in fetal movement. It is important to perform a fetal movement count at the same time each day when your baby is normally most active.  HOW TO COUNT FETAL MOVEMENTS 1. Find a quiet and comfortable area to sit or lie down on your left side. Lying on your left side provides the best blood and oxygen circulation to your baby. 2. Write down the day and time on a sheet of paper or in a journal. 3. Start counting kicks, flutters, swishes, rolls, or jabs in a 2 hour period. You should feel at least 10 movements within 2 hours. 4. If you do not feel 10 movements in 2 hours, wait 2 3 hours and count again. Look for a change in the pattern or not enough counts in 2 hours. SEEK MEDICAL CARE IF:  You feel less than 10 counts in 2 hours, tried twice.  There is no movement in over an hour.  The pattern is changing or taking longer each day to reach 10 counts in 2 hours.  You feel the baby is not moving as he or she usually does. Date: ____________ Movements: ____________ Start time: ____________ Finish time: ____________  Date: ____________ Movements: ____________ Start time: ____________ Finish time: ____________ Date: ____________ Movements: ____________ Start time: ____________ Finish time: ____________ Date: ____________ Movements: ____________  Start time: ____________ Finish time: ____________ Date: ____________ Movements: ____________ Start time: ____________ Finish time: ____________ Date: ____________ Movements: ____________ Start time: ____________ Finish time: ____________ Date: ____________ Movements: ____________ Start time: ____________ Finish time: ____________ Date: ____________ Movements: ____________ Start time: ____________ Finish time: ____________  Date: ____________ Movements: ____________ Start time: ____________ Finish time: ____________ Date: ____________ Movements: ____________ Start time: ____________ Finish time: ____________ Date: ____________ Movements: ____________ Start time: ____________ Finish time: ____________ Date: ____________ Movements: ____________ Start time: ____________ Finish time: ____________ Date: ____________ Movements: ____________ Start time: ____________ Finish time: ____________ Date: ____________ Movements: ____________ Start time: ____________ Finish time: ____________ Date: ____________ Movements: ____________ Start time: ____________ Finish time: ____________  Date: ____________ Movements: ____________ Start time: ____________ Finish time: ____________ Date: ____________ Movements: ____________ Start time: ____________ Finish time: ____________ Date: ____________ Movements: ____________ Start time: ____________ Finish time: ____________ Date: ____________ Movements: ____________ Start time: ____________ Finish time: ____________ Date: ____________ Movements: ____________ Start time: ____________ Finish time: ____________ Date: ____________ Movements: ____________ Start time: ____________ Finish time: ____________ Date: ____________ Movements: ____________ Start time: ____________ Finish time: ____________  Date: ____________ Movements: ____________ Start time: ____________ Finish time: ____________ Date: ____________ Movements: ____________ Start time: ____________ Finish time:  ____________ Date: ____________ Movements: ____________ Start time: ____________ Finish time: ____________ Date: ____________ Movements: ____________ Start time: ____________ Finish time: ____________ Date: ____________ Movements: ____________ Start time: ____________ Finish time: ____________ Date: ____________ Movements: ____________ Start time: ____________ Finish time: ____________ Date: ____________ Movements: ____________ Start time: ____________ Finish time: ____________  Date: ____________ Movements: ____________ Start time: ____________ Finish   time: ____________ Date: ____________ Movements: ____________ Start time: ____________ Finish time: ____________ Date: ____________ Movements: ____________ Start time: ____________ Finish time: ____________ Date: ____________ Movements: ____________ Start time: ____________ Finish time: ____________ Date: ____________ Movements: ____________ Start time: ____________ Finish time: ____________ Date: ____________ Movements: ____________ Start time: ____________ Finish time: ____________ Date: ____________ Movements: ____________ Start time: ____________ Finish time: ____________  Date: ____________ Movements: ____________ Start time: ____________ Finish time: ____________ Date: ____________ Movements: ____________ Start time: ____________ Finish time: ____________ Date: ____________ Movements: ____________ Start time: ____________ Finish time: ____________ Date: ____________ Movements: ____________ Start time: ____________ Finish time: ____________ Date: ____________ Movements: ____________ Start time: ____________ Finish time: ____________ Date: ____________ Movements: ____________ Start time: ____________ Finish time: ____________ Date: ____________ Movements: ____________ Start time: ____________ Finish time: ____________  Date: ____________ Movements: ____________ Start time: ____________ Finish time: ____________ Date: ____________ Movements:  ____________ Start time: ____________ Finish time: ____________ Date: ____________ Movements: ____________ Start time: ____________ Finish time: ____________ Date: ____________ Movements: ____________ Start time: ____________ Finish time: ____________ Date: ____________ Movements: ____________ Start time: ____________ Finish time: ____________ Date: ____________ Movements: ____________ Start time: ____________ Finish time: ____________ Date: ____________ Movements: ____________ Start time: ____________ Finish time: ____________  Date: ____________ Movements: ____________ Start time: ____________ Finish time: ____________ Date: ____________ Movements: ____________ Start time: ____________ Finish time: ____________ Date: ____________ Movements: ____________ Start time: ____________ Finish time: ____________ Date: ____________ Movements: ____________ Start time: ____________ Finish time: ____________ Date: ____________ Movements: ____________ Start time: ____________ Finish time: ____________ Date: ____________ Movements: ____________ Start time: ____________ Finish time: ____________ Document Released: 08/05/2006 Document Revised: 06/22/2012 Document Reviewed: 05/02/2012 [employer] Patient Information 2014 [employer].  

## 2013-11-27 NOTE — Telephone Encounter (Signed)
Pt states that she has been hurting in her back and around her waist since yesterday. Pt states that she is having tingling in the front of her stomach, sharp pains that shoot down through her vagina. Pt states that the pain in her lower back hurts constantly. Pt states that she has good baby movement, denies any bleeding or gush of fluid. Pt states that she fell about a week ago and went to [facility] and everything was fine. Pt given Flexiril but it doesn't help. Pt states that she has an appointment on this Thursday.  Pt switched up front to be given an appointment for this afternoon.

## 2013-11-27 NOTE — Progress Notes (Signed)
[redacted]w[redacted]d [id number] female presents for prenatal visit today. She complains of lower back pain, lower abdominal pain, and pelvic pain for the past 1-2 days. She describes this pain as "ripping". Exam normal for gest age. IMP . NORmal aches of late pregnancy.

## 2013-11-30 ENCOUNTER — Encounter: Payer: Medicaid Other | Admitting: Advanced Practice Midwife

## 2013-12-03 ENCOUNTER — Emergency Department (HOSPITAL_COMMUNITY)
Admission: EM | Admit: 2013-12-03 | Discharge: 2013-12-03 | Disposition: A | Discharge status: Home / Self Care | Payer: Medicaid Other | Attending: Emergency Medicine | Admitting: Emergency Medicine

## 2013-12-03 ENCOUNTER — Encounter (HOSPITAL_COMMUNITY): Payer: Self-pay | Admitting: Emergency Medicine

## 2013-12-03 DIAGNOSIS — F5089 Other specified eating disorder: Secondary | ICD-10-CM

## 2013-12-03 DIAGNOSIS — Z792 Long term (current) use of antibiotics: Secondary | ICD-10-CM | POA: Insufficient documentation

## 2013-12-03 DIAGNOSIS — O26899 Other specified pregnancy related conditions, unspecified trimester: Secondary | ICD-10-CM

## 2013-12-03 DIAGNOSIS — Z3480 Encounter for supervision of other normal pregnancy, unspecified trimester: Secondary | ICD-10-CM

## 2013-12-03 DIAGNOSIS — Z8659 Personal history of other mental and behavioral disorders: Secondary | ICD-10-CM | POA: Insufficient documentation

## 2013-12-03 DIAGNOSIS — O09299 Supervision of pregnancy with other poor reproductive or obstetric history, unspecified trimester: Secondary | ICD-10-CM

## 2013-12-03 DIAGNOSIS — O99330 Smoking (tobacco) complicating pregnancy, unspecified trimester: Secondary | ICD-10-CM | POA: Insufficient documentation

## 2013-12-03 DIAGNOSIS — Z79899 Other long term (current) drug therapy: Secondary | ICD-10-CM | POA: Insufficient documentation

## 2013-12-03 DIAGNOSIS — J45909 Unspecified asthma, uncomplicated: Secondary | ICD-10-CM | POA: Insufficient documentation

## 2013-12-03 DIAGNOSIS — R109 Unspecified abdominal pain: Secondary | ICD-10-CM | POA: Insufficient documentation

## 2013-12-03 DIAGNOSIS — O9989 Other specified diseases and conditions complicating pregnancy, childbirth and the puerperium: Secondary | ICD-10-CM | POA: Insufficient documentation

## 2013-12-03 DIAGNOSIS — Z9889 Other specified postprocedural states: Secondary | ICD-10-CM | POA: Insufficient documentation

## 2013-12-03 DIAGNOSIS — G43909 Migraine, unspecified, not intractable, without status migrainosus: Secondary | ICD-10-CM | POA: Insufficient documentation

## 2013-12-03 HISTORY — DX: Encounter for supervision of normal pregnancy, unspecified, unspecified trimester: Z34.90

## 2013-12-03 LAB — URINALYSIS, ROUTINE W REFLEX MICROSCOPIC
Bilirubin Urine: NEGATIVE
GLUCOSE, UA: NEGATIVE mg/dL
Hgb urine dipstick: NEGATIVE
Ketones, ur: NEGATIVE mg/dL
LEUKOCYTES UA: NEGATIVE
Nitrite: NEGATIVE
Protein, ur: NEGATIVE mg/dL
Specific Gravity, Urine: 1.015 (ref 1.005–1.030)
Urobilinogen, UA: 0.2 mg/dL (ref 0.0–1.0)
pH: 6 (ref 5.0–8.0)

## 2013-12-03 NOTE — ED Notes (Signed)
Patient resting comfortably with eyes closed.  Patient with no distress noted at this time.  Equal rise and fall of chest noted.  Will continue to monitor patient.

## 2013-12-03 NOTE — ED Notes (Signed)
Per [name], [facility])  Watch patient on the monitor for 1 hour and then reassess cervix.  Pt to have PO fluids,  And check urinalysis

## 2013-12-03 NOTE — ED Notes (Signed)
Katie from [employer] called to say that if patient's cervix is rechecked and there is no change, then she can be discharged home.  Dr. Roxanne Mins aware.

## 2013-12-03 NOTE — Progress Notes (Signed)
RROB spoke with Maudie Mercury Booker,CNM about pt; sve, signs/symptoms, she reviewed strip.  CNM advised to get u/a, po hydration, monitor efm/contractions for an hour and recheck cervix.  If pt not changing then pt may be d/c'd to home to follow up at office/keep regular appt.  If any questions or concerns, then may call and/or transfer pt to whog.  RROB relayed this information to ED RN who said she will notify ED provider.

## 2013-12-03 NOTE — Discharge Instructions (Signed)
Abdominal Pain During Pregnancy °Abdominal pain is common in pregnancy. Most of the time, it does not cause harm. There are many causes of abdominal pain. Some causes are more serious than others. Some of the causes of abdominal pain in pregnancy are easily diagnosed. Occasionally, the diagnosis takes time to understand. Other times, the cause is not determined. Abdominal pain can be a sign that something is very wrong with the pregnancy, or the pain may have nothing to do with the pregnancy at all. For this reason, always tell your health care provider if you have any abdominal discomfort. °HOME CARE INSTRUCTIONS  °Monitor your abdominal pain for any changes. The following actions may help to alleviate any discomfort you are experiencing: °· Do not have sexual intercourse or put anything in your vagina until your symptoms go away completely. °· Get plenty of rest until your pain improves. °· Drink clear fluids if you feel nauseous. Avoid solid food as long as you are uncomfortable or nauseous. °· Only take over-the-counter or prescription medicine as directed by your health care provider. °· Keep all follow-up appointments with your health care provider. °SEEK IMMEDIATE MEDICAL CARE IF: °· You are bleeding, leaking fluid, or passing tissue from the vagina. °· You have increasing pain or cramping. °· You have persistent vomiting. °· You have painful or bloody urination. °· You have a fever. °· You notice a decrease in your baby's movements. °· You have extreme weakness or feel faint. °· You have shortness of breath, with or without abdominal pain. °· You develop a severe headache with abdominal pain. °· You have abnormal vaginal discharge with abdominal pain. °· You have persistent diarrhea. °· You have abdominal pain that continues even after rest, or gets worse. °MAKE SURE YOU:  °· Understand these instructions. °· Will watch your condition. °· Will get help right away if you are not doing well or get  worse. °Document Released: 07/06/2005 Document Revised: 04/26/2013 Document Reviewed: 02/02/2013 °Exit[employer]® Patient Information ©2014 [employer]. ° °

## 2013-12-03 NOTE — ED Provider Notes (Signed)
CSN: [id number]     Arrival date & time 12/03/13  0039 History   First MD Initiated Contact with Patient 12/03/13 0102     Chief Complaint  Patient presents with  . Abdominal Pain-pregnant      (Consider location/radiation/quality/duration/timing/severity/associated sxs/prior Treatment) The history is provided by the patient.   23 year old female has been having waxing and waning pain across her abdomen radiating to the rectum since mid afternoon. She states she has a sense of rectal urgency but has not been able to defecate. She is a [redacted] weeks gestation, gravida 4, para 2-0-1-2. She denies any complications of pregnancy. Pain is moderate and she rates it at 5/10. Nothing makes it better nothing makes it worse. She has felt fetal movement.  Past Medical History  Diagnosis Date  . Asthma   . ADHD   . Migraines     with aura (see flashes)  . Pregnant    Past Surgical History  Procedure Laterality Date  . Dilation and curettage of uterus    . Wisdom tooth extraction    . Jaw wired shut    . 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 Diabetes Mother   . Liver disease Mother     fatty liver   . Hypertension Father   . COPD Father   . Hypertension Brother   . COPD Brother    History  Substance Use Topics  . Smoking status: Current Some Day Smoker -- 1.00 packs/day    Types: Cigarettes  . Smokeless tobacco: Never Used  . Alcohol Use: No   OB History   Grav Para Term Preterm Abortions TAB SAB Ect Mult Living   4 2 2  1  1   2      Review of Systems  All other systems reviewed and are negative.     Allergies  Darvocet; Imitrex; Adhesive; Codeine; Percocet; and Sulfa antibiotics  Home Medications   Prior to Admission medications   Medication Sig Start Date End Date Taking? Authorizing Provider  acetaminophen (TYLENOL) 325 MG tablet Take 650 mg by mouth every 6 (six) hours as needed.   Yes Historical Provider, MD  albuterol  (PROVENTIL HFA;VENTOLIN HFA) 108 (90 BASE) MCG/ACT inhaler Inhale into the lungs every 6 (six) hours as needed for wheezing or shortness of breath.   Yes Historical Provider, MD  Pediatric Multiple Vit-C-FA (FLINSTONES GUMMIES OMEGA-3 DHA PO) Take by mouth. Takes 2 daily   Yes Historical Provider, MD  aspirin-sod bicarb-citric acid (ALKA-SELTZER) 325 MG TBEF tablet Take 325 mg by mouth every 6 (six) hours as needed.    Historical Provider, MD  cephALEXin (KEFLEX) 500 MG capsule Take 1 capsule (500 mg total) by mouth 3 (three) times daily. 07/17/13   Florian Buff, MD  cyclobenzaprine (FLEXERIL) 10 MG tablet Take 1 tablet (10 mg total) by mouth 3 (three) times daily as needed for muscle spasms. 11/22/13   Heather Erby Pian, CNM  loratadine (CLARITIN) 10 MG tablet Take 10 mg by mouth daily.    Historical Provider, MD  pramoxine (PROCTOFOAM) 1 % foam Place 1 application rectally 3 (three) times daily as needed for itching. 08/15/13   Christin Fudge, CNM  Prenatal Vit-Fe Fumarate-FA (MULTIVITAMIN-PRENATAL) 27-0.8 MG TABS tablet Take 1 tablet by mouth daily at 12 noon.    Historical Provider, MD   BP 109/61  Pulse 92  Resp 18  SpO2 99%  LMP 03/20/2013 Physical Exam  Nursing note and vitals reviewed.  23 year old female, resting comfortably and in no acute distress. Vital signs are normal. Oxygen saturation is 99%, which is normal. Head is normocephalic and atraumatic. PERRLA, EOMI. Oropharynx is clear. Neck is nontender and supple without adenopathy or JVD. Back is nontender and there is no CVA tenderness. Lungs are clear without rales, wheezes, or rhonchi. Chest is nontender. Heart has regular rate and rhythm without murmur. Abdomen is gravid uterus which is nontender, size appropriate for stage of gestation. No abdominal tenderness is present. There are and no other masses or hepatosplenomegaly. Pelvic: Normal external female genitalia. Digital exam shows cervix is dilated 1 cm and  80% effaced. Extremities have no cyanosis or edema, full range of motion is present. Skin is warm and dry without rash. Neurologic: Mental status is normal, cranial nerves are intact, there are no motor or sensory deficits.  ED Course  Procedures (including critical care time) Labs Review Results for orders placed during the hospital encounter of 12/03/13  URINALYSIS, ROUTINE W REFLEX MICROSCOPIC      Result Value Ref Range   Color, Urine YELLOW  YELLOW   APPearance CLEAR  CLEAR   Specific Gravity, Urine 1.015  1.005 - 1.030   pH 6.0  5.0 - 8.0   Glucose, UA NEGATIVE  NEGATIVE mg/dL   Hgb urine dipstick NEGATIVE  NEGATIVE   Bilirubin Urine NEGATIVE  NEGATIVE   Ketones, ur NEGATIVE  NEGATIVE mg/dL   Protein, ur NEGATIVE  NEGATIVE mg/dL   Urobilinogen, UA 0.2  0.0 - 1.0 mg/dL   Nitrite NEGATIVE  NEGATIVE   Leukocytes, UA NEGATIVE  NEGATIVE   MDM   Final diagnoses:  Abdominal pain in pregnancy    Abdominal and rectal discomfort which may represent labor. She will need to be observed in the ED. Fetal monitoring has been initiated.  Case was discussed with Dr. Roselie Awkward who is on call for GYN. Without evidence of contractions and without evidence of cervical dilatation, he feels that she would be safe to send home. She is observed in the ED with fetal monitoring showing no significant contractions. Manual pelvic exam shows no change in cervical dilatation or effacement. She is discharged with instructions to followup with her gynecologist there she does have an appointment scheduled for 5 days from now she is to keep that appointment.  Delora Fuel, MD 18/84/16 [id number]

## 2013-12-03 NOTE — Progress Notes (Signed)
RROB notified of pt in with c/o pain and pressure in bottom like she needs to have a BM.  RROB told RN that pt's cervix needs to be checked asap.  [facility]-RN called ED provider and told of pt needing to have cervical exam.  ED provider told ED RN that it may be up to 45min before able to check cervix; RROB said that pt needs to be checked now, RN is going to call MD again.

## 2013-12-12 ENCOUNTER — Encounter: Payer: Self-pay | Admitting: Advanced Practice Midwife

## 2013-12-12 ENCOUNTER — Ambulatory Visit (INDEPENDENT_AMBULATORY_CARE_PROVIDER_SITE_OTHER): Payer: Medicaid Other | Admitting: Advanced Practice Midwife

## 2013-12-12 VITALS — BP 120/60 | Wt 194.0 lb

## 2013-12-12 DIAGNOSIS — R768 Other specified abnormal immunological findings in serum: Secondary | ICD-10-CM

## 2013-12-12 DIAGNOSIS — R894 Abnormal immunological findings in specimens from other organs, systems and tissues: Secondary | ICD-10-CM

## 2013-12-12 DIAGNOSIS — B86 Scabies: Secondary | ICD-10-CM

## 2013-12-12 DIAGNOSIS — Z331 Pregnant state, incidental: Secondary | ICD-10-CM

## 2013-12-12 DIAGNOSIS — Z3480 Encounter for supervision of other normal pregnancy, unspecified trimester: Secondary | ICD-10-CM

## 2013-12-12 LAB — OB RESULTS CONSOLE GC/CHLAMYDIA
CHLAMYDIA, DNA PROBE: NEGATIVE
GC PROBE AMP, GENITAL: NEGATIVE

## 2013-12-12 MED ORDER — ACYCLOVIR 400 MG PO TABS: 400.0000 mg | ORAL_TABLET | Freq: Three times a day (TID) | ORAL | Status: DC

## 2013-12-12 MED ORDER — PERMETHRIN 5 % EX CREA: 1.0000 "application " | TOPICAL_CREAM | Freq: Once | CUTANEOUS | Status: DC

## 2013-12-12 NOTE — Progress Notes (Signed)
Started on acyclovir today.  GBS collected today.   No c/o at this time. Still eating toilet paper, normal BM's.  Saw therapist, plans to continue.  Scabies rash.  Rx elemite.   Routine questions about pregnancy answered.  F/U in 1 weeks for EFW for hx macrosomia and a Low-risk ob appt .

## 2013-12-12 NOTE — Patient Instructions (Signed)
Scabies  Scabies are small bugs (mites) that burrow under the skin and cause red bumps and severe itching. These bugs can only be seen with a microscope. Scabies are highly contagious. They can spread easily from person to person by direct contact. They are also spread through sharing clothing or linens that have the scabies mites living in them. It is not unusual for an entire family to become infected through shared towels, clothing, or bedding.   HOME CARE INSTRUCTIONS   · Your caregiver may prescribe a cream or lotion to kill the mites. If cream is prescribed, massage the cream into the entire body from the neck to the bottom of both feet. Also massage the cream into the scalp and face if your child is less than 1 year old. Avoid the eyes and mouth. Do not wash your hands after application.  · Leave the cream on for 8 to 12 hours. Your child should bathe or shower after the 8 to 12 hour application period. Sometimes it is helpful to apply the cream to your child right before bedtime.  · One treatment is usually effective and will eliminate approximately 95% of infestations. For severe cases, your caregiver may decide to repeat the treatment in 1 week. Everyone in your household should be treated with one application of the cream.  · New rashes or burrows should not appear within 24 to 48 hours after successful treatment. However, the itching and rash may last for 2 to 4 weeks after successful treatment. Your caregiver may prescribe a medicine to help with the itching or to help the rash go away more quickly.  · Scabies can live on clothing or linens for up to 3 days. All of your child's recently used clothing, towels, stuffed toys, and bed linens should be washed in hot water and then dried in a dryer for at least 20 minutes on high heat. Items that cannot be washed should be enclosed in a plastic bag for at least 3 days.  · To help relieve itching, bathe your child in a cool bath or apply cool washcloths to the  affected areas.  · Your child may return to school after treatment with the prescribed cream.  SEEK MEDICAL CARE IF:   · The itching persists longer than 4 weeks after treatment.  · The rash spreads or becomes infected. Signs of infection include red blisters or yellow-tan crust.  Document Released: 07/06/2005 Document Revised: 09/28/2011 Document Reviewed: 11/14/2008  Exit[employer]® Patient Information ©2014 [employer].

## 2013-12-12 NOTE — Addendum Note (Signed)
Addended by: Traci Sermon A on: 12/12/2013 04:09 PM   Modules accepted: Orders

## 2013-12-13 LAB — GC/CHLAMYDIA PROBE AMP
CT Probe RNA: NEGATIVE
GC Probe RNA: NEGATIVE

## 2013-12-13 LAB — STREP B DNA PROBE: GBSP: DETECTED

## 2013-12-19 ENCOUNTER — Encounter: Payer: Self-pay | Admitting: Advanced Practice Midwife

## 2013-12-19 ENCOUNTER — Ambulatory Visit (INDEPENDENT_AMBULATORY_CARE_PROVIDER_SITE_OTHER): Payer: Medicaid Other

## 2013-12-19 ENCOUNTER — Ambulatory Visit (INDEPENDENT_AMBULATORY_CARE_PROVIDER_SITE_OTHER): Payer: Medicaid Other | Admitting: Advanced Practice Midwife

## 2013-12-19 ENCOUNTER — Other Ambulatory Visit: Payer: Self-pay | Admitting: Advanced Practice Midwife

## 2013-12-19 VITALS — BP 100/60 | Temp 98.7°F | Wt 194.0 lb

## 2013-12-19 DIAGNOSIS — Z1389 Encounter for screening for other disorder: Secondary | ICD-10-CM

## 2013-12-19 DIAGNOSIS — R768 Other specified abnormal immunological findings in serum: Secondary | ICD-10-CM

## 2013-12-19 DIAGNOSIS — O09299 Supervision of pregnancy with other poor reproductive or obstetric history, unspecified trimester: Secondary | ICD-10-CM

## 2013-12-19 DIAGNOSIS — Z331 Pregnant state, incidental: Secondary | ICD-10-CM

## 2013-12-19 LAB — POCT URINALYSIS DIPSTICK
Blood, UA: NEGATIVE
Glucose, UA: NEGATIVE
Ketones, UA: NEGATIVE
Nitrite, UA: NEGATIVE

## 2013-12-19 NOTE — Progress Notes (Signed)
U/S(38+2wks)-vtx active fetus EFW 7 lb 1 oz (44th%tile), fluid WNL AFI-11cm SDP-5.2cm, posterior/fundal Gr 2 placenta, FHR-129 bpm

## 2013-12-19 NOTE — Progress Notes (Signed)
Scabies better.  U/S (for hx of macrosomia) today was 44%, normal AFI.  Routine questions about pregnancy answered.  F/U in 1 weeks for Low-risk ob appt .

## 2013-12-26 ENCOUNTER — Encounter: Payer: Self-pay | Admitting: Obstetrics & Gynecology

## 2013-12-26 ENCOUNTER — Ambulatory Visit (INDEPENDENT_AMBULATORY_CARE_PROVIDER_SITE_OTHER): Payer: Medicaid Other | Admitting: Obstetrics & Gynecology

## 2013-12-26 VITALS — BP 100/60 | Wt 194.0 lb

## 2013-12-26 DIAGNOSIS — Z1389 Encounter for screening for other disorder: Secondary | ICD-10-CM

## 2013-12-26 DIAGNOSIS — Z331 Pregnant state, incidental: Secondary | ICD-10-CM

## 2013-12-26 DIAGNOSIS — Z3480 Encounter for supervision of other normal pregnancy, unspecified trimester: Secondary | ICD-10-CM

## 2013-12-26 LAB — POCT URINALYSIS DIPSTICK
GLUCOSE UA: NEGATIVE
Ketones, UA: NEGATIVE
LEUKOCYTES UA: NEGATIVE
NITRITE UA: NEGATIVE
RBC UA: NEGATIVE

## 2013-12-26 NOTE — Progress Notes (Signed)
[id number] [redacted]w[redacted]d Estimated Date of Delivery: 12/31/13  Blood pressure 100/60, weight 194 lb (87.998 kg), last menstrual period 03/20/2013.   BP weight and urine results all reviewed and noted.  Please refer to the obstetrical flow sheet for the fundal height and fetal heart rate documentation:  Patient reports good fetal movement, denies any bleeding and no rupture of membranes symptoms or regular contractions. Patient is without complaints. All questions were answered.  Plan:  Continued routine obstetrical care  Follow up in 1 weeks for OB appointment, routine appt

## 2013-12-30 ENCOUNTER — Encounter (HOSPITAL_COMMUNITY): Payer: Self-pay | Admitting: *Deleted

## 2013-12-30 ENCOUNTER — Inpatient Hospital Stay (HOSPITAL_COMMUNITY)
Admission: AD | Admit: 2013-12-30 | Discharge: 2013-12-31 | Disposition: A | Discharge status: Home / Self Care | Payer: Medicaid Other | Source: Ambulatory Visit | Attending: Obstetrics and Gynecology | Admitting: Obstetrics and Gynecology

## 2013-12-30 DIAGNOSIS — F5089 Other specified eating disorder: Secondary | ICD-10-CM

## 2013-12-30 DIAGNOSIS — Z87891 Personal history of nicotine dependence: Secondary | ICD-10-CM | POA: Insufficient documentation

## 2013-12-30 DIAGNOSIS — O9989 Other specified diseases and conditions complicating pregnancy, childbirth and the puerperium: Principal | ICD-10-CM

## 2013-12-30 DIAGNOSIS — O09299 Supervision of pregnancy with other poor reproductive or obstetric history, unspecified trimester: Secondary | ICD-10-CM

## 2013-12-30 DIAGNOSIS — O99891 Other specified diseases and conditions complicating pregnancy: Secondary | ICD-10-CM | POA: Insufficient documentation

## 2013-12-30 DIAGNOSIS — M549 Dorsalgia, unspecified: Secondary | ICD-10-CM | POA: Insufficient documentation

## 2013-12-30 DIAGNOSIS — Z833 Family history of diabetes mellitus: Secondary | ICD-10-CM | POA: Insufficient documentation

## 2013-12-30 DIAGNOSIS — B86 Scabies: Secondary | ICD-10-CM

## 2013-12-30 DIAGNOSIS — Z3480 Encounter for supervision of other normal pregnancy, unspecified trimester: Secondary | ICD-10-CM

## 2013-12-30 LAB — URINALYSIS, ROUTINE W REFLEX MICROSCOPIC
Bilirubin Urine: NEGATIVE
Glucose, UA: 100 mg/dL — AB
Hgb urine dipstick: NEGATIVE
KETONES UR: 15 mg/dL — AB
Leukocytes, UA: NEGATIVE
NITRITE: NEGATIVE
PROTEIN: NEGATIVE mg/dL
Specific Gravity, Urine: 1.02 (ref 1.005–1.030)
Urobilinogen, UA: 1 mg/dL (ref 0.0–1.0)
pH: 6 (ref 5.0–8.0)

## 2013-12-30 MED ORDER — CYCLOBENZAPRINE HCL 10 MG PO TABS: 5.0000 mg | ORAL_TABLET | Freq: Three times a day (TID) | ORAL | Status: DC | PRN

## 2013-12-30 NOTE — Discharge Instructions (Signed)
Third Trimester of Pregnancy °The third trimester is from week 29 through week 42, months 7 through 9. The third trimester is a time when the fetus is growing rapidly. At the end of the ninth month, the fetus is about 20 inches in length and weighs 6 10 pounds.  °BODY CHANGES °Your body goes through many changes during pregnancy. The changes vary from woman to woman.  °· Your weight will continue to increase. You can expect to gain 25 35 pounds (11 16 kg) by the end of the pregnancy. °· You may begin to get stretch marks on your hips, abdomen, and breasts. °· You may urinate more often because the fetus is moving lower into your pelvis and pressing on your bladder. °· You may develop or continue to have heartburn as a result of your pregnancy. °· You may develop constipation because certain hormones are causing the muscles that push waste through your intestines to slow down. °· You may develop hemorrhoids or swollen, bulging veins (varicose veins). °· You may have pelvic pain because of the weight gain and pregnancy hormones relaxing your joints between the bones in your pelvis. Back aches may result from over exertion of the muscles supporting your posture. °· Your breasts will continue to grow and be tender. A yellow discharge may leak from your breasts called colostrum. °· Your belly button may stick out. °· You may feel short of breath because of your expanding uterus. °· You may notice the fetus "dropping," or moving lower in your abdomen. °· You may have a bloody mucus discharge. This usually occurs a few days to a week before labor begins. °· Your cervix becomes thin and soft (effaced) near your due date. °WHAT TO EXPECT AT YOUR PRENATAL EXAMS  °You will have prenatal exams every 2 weeks until week 36. Then, you will have weekly prenatal exams. During a routine prenatal visit: °· You will be weighed to make sure you and the fetus are growing normally. °· Your blood pressure is taken. °· Your abdomen will be  measured to track your baby's growth. °· The fetal heartbeat will be listened to. °· Any test results from the previous visit will be discussed. °· You may have a cervical check near your due date to see if you have effaced. °At around 36 weeks, your caregiver will check your cervix. At the same time, your caregiver will also perform a test on the secretions of the vaginal tissue. This test is to determine if a type of bacteria, Group B streptococcus, is present. Your caregiver will explain this further. °Your caregiver may ask you: °· What your birth plan is. °· How you are feeling. °· If you are feeling the baby move. °· If you have had any abnormal symptoms, such as leaking fluid, bleeding, severe headaches, or abdominal cramping. °· If you have any questions. °Other tests or screenings that may be performed during your third trimester include: °· Blood tests that check for low iron levels (anemia). °· Fetal testing to check the health, activity level, and growth of the fetus. Testing is done if you have certain medical conditions or if there are problems during the pregnancy. °FALSE LABOR °You may feel small, irregular contractions that eventually go away. These are called Braxton Hicks contractions, or false labor. Contractions may last for hours, days, or even weeks before true labor sets in. If contractions come at regular intervals, intensify, or become painful, it is best to be seen by your caregiver.  °  SIGNS OF LABOR  °· Menstrual-like cramps. °· Contractions that are 5 minutes apart or less. °· Contractions that start on the top of the uterus and spread down to the lower abdomen and back. °· A sense of increased pelvic pressure or back pain. °· A watery or bloody mucus discharge that comes from the vagina. °If you have any of these signs before the 37th week of pregnancy, call your caregiver right away. You need to go to the hospital to get checked immediately. °HOME CARE INSTRUCTIONS  °· Avoid all  smoking, herbs, alcohol, and unprescribed drugs. These chemicals affect the formation and growth of the baby. °· Follow your caregiver's instructions regarding medicine use. There are medicines that are either safe or unsafe to take during pregnancy. °· Exercise only as directed by your caregiver. Experiencing uterine cramps is a good sign to stop exercising. °· Continue to eat regular, healthy meals. °· Wear a good support bra for breast tenderness. °· Do not use hot tubs, steam rooms, or saunas. °· Wear your seat belt at all times when driving. °· Avoid raw meat, uncooked cheese, cat litter boxes, and soil used by cats. These carry germs that can cause birth defects in the baby. °· Take your prenatal vitamins. °· Try taking a stool softener (if your caregiver approves) if you develop constipation. Eat more high-fiber foods, such as fresh vegetables or fruit and whole grains. Drink plenty of fluids to keep your urine clear or pale yellow. °· Take warm sitz baths to soothe any pain or discomfort caused by hemorrhoids. Use hemorrhoid cream if your caregiver approves. °· If you develop varicose veins, wear support hose. Elevate your feet for 15 minutes, 3 4 times a day. Limit salt in your diet. °· Avoid heavy lifting, wear low heal shoes, and practice good posture. °· Rest a lot with your legs elevated if you have leg cramps or low back pain. °· Visit your dentist if you have not gone during your pregnancy. Use a soft toothbrush to brush your teeth and be gentle when you floss. °· A sexual relationship may be continued unless your caregiver directs you otherwise. °· Do not travel far distances unless it is absolutely necessary and only with the approval of your caregiver. °· Take prenatal classes to understand, practice, and ask questions about the labor and delivery. °· Make a trial run to the hospital. °· Pack your hospital bag. °· Prepare the baby's nursery. °· Continue to go to all your prenatal visits as directed  by your caregiver. °SEEK MEDICAL CARE IF: °· You are unsure if you are in labor or if your water has broken. °· You have dizziness. °· You have mild pelvic cramps, pelvic pressure, or nagging pain in your abdominal area. °· You have persistent nausea, vomiting, or diarrhea. °· You have a bad smelling vaginal discharge. °· You have pain with urination. °SEEK IMMEDIATE MEDICAL CARE IF:  °· You have a fever. °· You are leaking fluid from your vagina. °· You have spotting or bleeding from your vagina. °· You have severe abdominal cramping or pain. °· You have rapid weight loss or gain. °· You have shortness of breath with chest pain. °· You notice sudden or extreme swelling of your face, hands, ankles, feet, or legs. °· You have not felt your baby move in over an hour. °· You have severe headaches that do not go away with medicine. °· You have vision changes. °Document Released: 06/30/2001 Document Revised: 03/08/2013 Document Reviewed:   You have severe abdominal cramping or pain.   You have rapid weight loss or gain.   You have shortness of breath with chest pain.   You notice sudden or extreme swelling of your face, hands, ankles, feet, or legs.   You have not felt your baby move in over an hour.   You have severe headaches that do not go away with medicine.   You have vision changes.  Document Released: 06/30/2001 Document Revised: 03/08/2013 Document Reviewed: 09/06/2012  [employer] Patient Information 2014 [employer].

## 2013-12-30 NOTE — MAU Note (Addendum)
Pain in lower back is main pain  And some in  vaginal area. Occ pain lower abd. Denies bleeding. Some mucousy d/c but did have one spot fld on panties earlier tonight

## 2013-12-30 NOTE — MAU Provider Note (Signed)
Chief Complaint:  Back Pain   [name] [name] is a 23 y.o.  [id number] with IUP at [redacted]w[redacted]d presenting for Back Pain Back pain started yesterday. It occurs in her lower back and radiates into her buttock. She describes it as 9/10 and achy or sharp in nature. It is constant but it can be wax and waning. She doesn't feel that the pain is associated with contractions. She hasn't taken anything for pain. Sleeping helps get rid of the pain.  Sitting can aggravate the pain but also helps it. She's had no previous back injury or trauma. No abdominal surgery. No changes in voiding or bowel movements. She denies any loss of sensation or strength of her lower extremities. Pregnancy has been uncomplicated thus far.   Denies LOF or VB. Good FM.   Menstrual History: OB History   Grav Para Term Preterm Abortions TAB SAB Ect Mult Living   4 2 2  1  1   2       Patient's last menstrual period was 03/20/2013.      Past Medical History  Diagnosis Date  . Asthma   . ADHD   . Migraines     with aura (see flashes)  . Pregnant     Past Surgical History  Procedure Laterality Date  . Dilation and curettage of uterus    . Wisdom tooth extraction    . Jaw wired shut    . Penny removed from esophagus      Family History  Problem Relation Age of Onset  . Diabetes Mother   . Liver disease Mother     fatty liver   . Hypertension Father   . COPD Father   . Hypertension Brother   . COPD Brother     History  Substance Use Topics  . Smoking status: Former Smoker -- 1.00 packs/day    Types: Cigarettes  . Smokeless tobacco: Never Used  . Alcohol Use: No      Allergies  Allergen Reactions  . Darvocet [Propoxyphene N-Acetaminophen] Other (See Comments)    Hallucinations - Dislocated her jaw during a vivid episode  . Imitrex [Sumatriptan Base]     Patient states that imitrex gives her chest pain  . Adhesive [Tape] Hives  . Codeine   . Percocet [Oxycodone-Acetaminophen] Other (See Comments)    "made  me feel funny"   . Sulfa Antibiotics Swelling, Rash and Other (See Comments)    Eye drops at child -- caused pain, swelling, & redness.  Her doctor said it was an allergy.    Does not remember name of drug but had, "face" sound in its name.    Prescriptions prior to admission  Medication Sig Dispense Refill  . acetaminophen (TYLENOL) 325 MG tablet Take 650 mg by mouth every 6 (six) hours as needed.      Marland Kitchen acyclovir (ZOVIRAX) 400 MG tablet Take 1 tablet (400 mg total) by mouth 3 (three) times daily.  90 tablet  2  . albuterol (PROVENTIL HFA;VENTOLIN HFA) 108 (90 BASE) MCG/ACT inhaler Inhale into the lungs every 6 (six) hours as needed for wheezing or shortness of breath.      Marland Kitchen aspirin-sod bicarb-citric acid (ALKA-SELTZER) 325 MG TBEF tablet Take 325 mg by mouth every 6 (six) hours as needed.      . cephALEXin (KEFLEX) 500 MG capsule Take 1 capsule (500 mg total) by mouth 3 (three) times daily.  21 capsule  0  . cyclobenzaprine (FLEXERIL) 10 MG tablet Take 1 tablet (  10 mg total) by mouth 3 (three) times daily as needed for muscle spasms.  30 tablet  0  . loratadine (CLARITIN) 10 MG tablet Take 10 mg by mouth daily.      . Pediatric Multiple Vit-C-FA (FLINSTONES GUMMIES OMEGA-3 DHA PO) Take by mouth. Takes 2 daily      . permethrin (ELIMITE) 5 % cream Apply 1 application topically once.  60 g  2  . pramoxine (PROCTOFOAM) 1 % foam Place 1 application rectally 3 (three) times daily as needed for itching.  15 g  0  . Prenatal Vit-Fe Fumarate-FA (MULTIVITAMIN-PRENATAL) 27-0.8 MG TABS tablet Take 1 tablet by mouth daily at 12 noon.        Review of Systems - Negative except for what is mentioned in HPI.  Physical Exam  Blood pressure 124/65, pulse 111, temperature 98.2 F (36.8 C), resp. rate 20, height 5' (1.524 m), weight 90.81 kg (200 lb 3.2 oz), last menstrual period 03/20/2013. GENERAL: Well-developed, well-nourished female in no acute distress.  ABDOMEN: Soft, nontender, nondistended,  gravid.  EXTREMITIES: Nontender, no edema, 2+ distal pulses. Cervical Exam: Dilatation 2cm   Effacement 50%   Station -3   FHT:  Baseline rate 135 bpm   Variability moderate  Accelerations present   Decelerations none Contractions: irregular    Labs: Results for orders placed during the hospital encounter of 12/30/13 (from the past 24 hour(s))  URINALYSIS, ROUTINE W REFLEX MICROSCOPIC   Collection Time    12/30/13 10:00 PM      Result Value Ref Range   Color, Urine YELLOW  YELLOW   APPearance CLEAR  CLEAR   Specific Gravity, Urine 1.020  1.005 - 1.030   pH 6.0  5.0 - 8.0   Glucose, UA 100 (*) NEGATIVE mg/dL   Hgb urine dipstick NEGATIVE  NEGATIVE   Bilirubin Urine NEGATIVE  NEGATIVE   Ketones, ur 15 (*) NEGATIVE mg/dL   Protein, ur NEGATIVE  NEGATIVE mg/dL   Urobilinogen, UA 1.0  0.0 - 1.0 mg/dL   Nitrite NEGATIVE  NEGATIVE   Leukocytes, UA NEGATIVE  NEGATIVE    Imaging Studies:  US Ob Follow Up  12/19/2013   FOLLOW UP SONOGRAM   [name] [name] is in the office for a follow up sonogram for EFW.  She is a 23 y.o. year old G1P2012 with Estimated Date of Delivery: 12/31/13   now at  [redacted]w[redacted]d weeks gestation. Thus far the pregnancy has been  complicated by prev macrosomia.  GESTATION: SINGLETON  PRESENTATION: cephalic  FETAL ACTIVITY:          Heart rate         129 bpm          The fetus is active.  AMNIOTIC FLUID: The amniotic fluid volume is  normal, 11.0 cm SDP 5.3cm.  PLACENTA LOCALIZATION:  fundal, posterior GRADE 2  CERVIX: N/A unable to visualize  ADNEXA: The adnexa appears normal.   GESTATIONAL AGE AND  BIOMETRICS:  Gestational criteria: Estimated Date of Delivery: 12/31/13  now at [redacted]w[redacted]d  Previous Scans:  GESTATIONAL SAC            mm          weeks  CROWN RUMP LENGTH            mm          weeks  NUCHAL TRANSLUCENCY            mm  BIPARIETAL DIAMETER           8.74 cm         35+2 weeks  HEAD CIRCUMFERENCE           32.05 cm         36+1 weeks  ABDOMINAL CIRCUMFERENCE            33.74 cm         37+5 weeks  FEMUR LENGTH           7.11 cm         36+3 weeks                                                           AVERAGE EGA(BY THIS SCAN):   36+3 weeks                                                 ESTIMATED FETAL WEIGHT:        3193  grams, 44 % ANATOMICAL SURVEY                                                                             COMMENTS CEREBRAL VENTRICLES yes normal   CHOROID PLEXUS yes normal   CEREBELLUM yes normal   CISTERNA MAGNA yes normal   NUCHAL REGION yes normal   ORBITS no    NASAL BONE no    NOSE/LIP no    FACIAL PROFILE no    4 CHAMBERED HEART no    OUTFLOW TRACTS no    DIAPHRAGM yes normal   STOMACH yes normal   RENAL REGION no    BLADDER yes normal   CORD INSERTION no    3 VESSEL CORD yes normal   SPINE no    ARMS/HANDS no    LEGS/FEET no    GENITALIA no  female        SUSPECTED ABNORMALITIES:  no  QUALITY OF SCAN: satisfactory  TECHNICIAN COMMENTS:  U/S(38+2wks)-vtx active fetus EFW 7 lb 1 oz (44th%tile), fluid WNL  AFI-11cm SDP-5.2cm, posterior/fundal Gr 2 placenta, FHR-129 bpm   A copy of this report including all images has been saved and backed up to  a second source for retrieval if needed. All measures and details of the  anatomical scan, placentation, fluid volume and pelvic anatomy are  contained in that report.  [name] [name] 12/19/2013 10:41 AM  Clinical Impression and recommendations:  I have reviewed the sonogram results above, combined with the patient's  current clinical course, below are my impressions and any appropriate  recommendations for management based on the sonographic findings.   1.  [id number] Estimated Date of Delivery: 12/31/13 by  early ultrasound and  confirmed by today's sonographic dating 2.  Normal fetal sonographic findings, with NO EVIDENCE OF FETAL  MACROSOMIA, EFW 44%ILE, 7LB 1 OZ 3.  Normal general sonographic findings  Recommend routine prenatal care based on this sonogram    [name] [name] 12/19/2013 12:33 PM          Assessment: [name] [name] is  23 y.o. B2E1007 at [redacted]w[redacted]d presents with Back Pain .  Plan: Discharge to home.   #Back pain: MSK in nature. No change in cervix. Pain not associated with ctx.  - flexeril 5 mg daily  - f/u as scheduled.   #FWB: cat 1.   [name] [name] 6/13/201511:53 PM  I spoke with and examined patient and agree with resident's note and plan of care.  Fredrik Rigger, MD OB Fellow 12/31/2013 12:09 AM

## 2013-12-31 DIAGNOSIS — M549 Dorsalgia, unspecified: Secondary | ICD-10-CM | POA: Diagnosis not present

## 2013-12-31 NOTE — MAU Provider Note (Signed)
Attestation of Attending Supervision of Advanced Practitioner: Evaluation and management procedures were performed by the PA/NP/CNM/OB Fellow under my supervision/collaboration. Chart reviewed and agree with management and plan.  Baker Kogler V 12/31/2013 1:43 AM

## 2014-01-02 ENCOUNTER — Ambulatory Visit (INDEPENDENT_AMBULATORY_CARE_PROVIDER_SITE_OTHER): Payer: Medicaid Other | Admitting: Women's Health

## 2014-01-02 ENCOUNTER — Encounter: Payer: Self-pay | Admitting: Women's Health

## 2014-01-02 VITALS — BP 122/58 | Wt 198.0 lb

## 2014-01-02 DIAGNOSIS — Z1389 Encounter for screening for other disorder: Secondary | ICD-10-CM

## 2014-01-02 DIAGNOSIS — Z3480 Encounter for supervision of other normal pregnancy, unspecified trimester: Secondary | ICD-10-CM

## 2014-01-02 DIAGNOSIS — Z331 Pregnant state, incidental: Secondary | ICD-10-CM

## 2014-01-02 LAB — POCT URINALYSIS DIPSTICK
Glucose, UA: NEGATIVE
Ketones, UA: NEGATIVE
Leukocytes, UA: NEGATIVE
NITRITE UA: NEGATIVE
Protein, UA: NEGATIVE
RBC UA: NEGATIVE

## 2014-01-02 NOTE — Progress Notes (Signed)
Low-risk OB appointment P1P2162 [redacted]w[redacted]d Estimated Date of Delivery: 12/31/13 Blood pressure 122/58, weight 198 lb (89.812 kg), last menstrual period 03/20/2013.  BP, weight, and urine results all reviewed and noted.  Please refer to the obstetrical flow sheet for the fundal height and fetal heart rate documentation. Reports good fm.  Denies regular uc's, lof, vb, or uti s/s. No complaints. Requests SVE: tight 3/50/-2, soft, vtx Offered membrane sweeping, discussed r/b- pt decided to proceed, so membranes swept.  Reviewed labor s/s, fkc. Plan:  IOL 6/21 @ 0800 for postdates if needed

## 2014-01-02 NOTE — Patient Instructions (Signed)
Your induction is scheduled for 6/21 @ 8:00am. Go to [facility] hospital, Maternity Admissions Unit (Emergency) entrance and let them know you are there to be induced. They will send someone from Labor & Delivery to come get you.    Call the office [phone]) or go to [facility] if:  You begin to have strong, frequent contractions  Your water breaks.  Sometimes it is a big gush of fluid, sometimes it is just a trickle that keeps getting your panties wet or running down your legs  You have vaginal bleeding.  It is normal to have a small amount of spotting if your cervix was checked.   You don't feel your baby moving like normal.  If you don't, get you something to eat and drink and lay down and focus on feeling your baby move.  You should feel at least 10 movements in 2 hours.  If you don't, you should call the office or go to [place] Contractions Pregnancy is commonly associated with contractions of the uterus throughout the pregnancy. Towards the end of pregnancy (32 to 34 weeks), these contractions [facility] Ishmael Holter) can develop more often and may become more forceful. This is not true labor because these contractions do not result in opening (dilatation) and thinning of the cervix. They are sometimes difficult to tell apart from true labor because these contractions can be forceful and people have different pain tolerances. You should not feel embarrassed if you go to the hospital with false labor. Sometimes, the only way to tell if you are in true labor is for your caregiver to follow the changes in the cervix. How to tell the difference between true and false labor:  False labor.  The contractions of false labor are usually shorter, irregular and not as hard as those of true labor.  They are often felt in the front of the lower abdomen and in the groin.  They may leave with walking around or changing positions while lying down.  They get weaker and are shorter  lasting as time goes on.  These contractions are usually irregular.  They do not usually become progressively stronger, regular and closer together as with true labor.  True labor.  Contractions in true labor last 30 to 70 seconds, become very regular, usually become more intense, and increase in frequency.  They do not go away with walking.  The discomfort is usually felt in the top of the uterus and spreads to the lower abdomen and low back.  True labor can be determined by your caregiver with an exam. This will show that the cervix is dilating and getting thinner. If there are no prenatal problems or other health problems associated with the pregnancy, it is completely safe to be sent home with false labor and await the onset of true labor. HOME CARE INSTRUCTIONS   Keep up with your usual exercises and instructions.  Take medications as directed.  Keep your regular prenatal appointment.  Eat and drink lightly if you think you are going into labor.  If BH contractions are making you uncomfortable:  Change your activity position from lying down or resting to walking/walking to resting.  Sit and rest in a tub of warm water.  Drink 2 to 3 glasses of water. Dehydration may cause B-H contractions.  Do slow and deep breathing several times an hour. SEEK IMMEDIATE MEDICAL CARE IF:   Your contractions continue to become stronger, more regular, and closer together.  You have a gushing, burst or leaking of fluid from the vagina.  An oral temperature above 102 F (38.9 C) develops.  You have passage of blood-tinged mucus.  You develop vaginal bleeding.  You develop continuous belly (abdominal) pain.  You have low back pain that you never had before.  You feel the baby's head pushing down causing pelvic pressure.  The baby is not moving as much as it used to. Document Released: 07/06/2005 Document Revised: 09/28/2011 Document Reviewed: 04/17/2013 [facility] Patient  Information 2014 [place], [place].

## 2014-01-03 ENCOUNTER — Telehealth (HOSPITAL_COMMUNITY): Payer: Self-pay | Admitting: *Deleted

## 2014-01-03 NOTE — Telephone Encounter (Signed)
Preadmission screen  

## 2014-01-07 ENCOUNTER — Inpatient Hospital Stay (HOSPITAL_COMMUNITY): Payer: Medicaid Other | Admitting: Anesthesiology

## 2014-01-07 ENCOUNTER — Encounter (HOSPITAL_COMMUNITY): Payer: Medicaid Other | Admitting: Anesthesiology

## 2014-01-07 ENCOUNTER — Encounter (HOSPITAL_COMMUNITY): Payer: Self-pay

## 2014-01-07 ENCOUNTER — Inpatient Hospital Stay (HOSPITAL_COMMUNITY)
Admission: RE | Admit: 2014-01-07 | Discharge: 2014-01-09 | DRG: 775 | Disposition: A | Discharge status: Home / Self Care | Payer: Medicaid Other | Source: Ambulatory Visit | Attending: Obstetrics & Gynecology | Admitting: Obstetrics & Gynecology

## 2014-01-07 VITALS — BP 86/48 | HR 64 | Temp 97.7°F | Resp 18 | Ht 60.0 in | Wt 198.0 lb

## 2014-01-07 DIAGNOSIS — Z22330 Carrier of Group B streptococcus: Secondary | ICD-10-CM

## 2014-01-07 DIAGNOSIS — Z833 Family history of diabetes mellitus: Secondary | ICD-10-CM

## 2014-01-07 DIAGNOSIS — D649 Anemia, unspecified: Secondary | ICD-10-CM | POA: Diagnosis present

## 2014-01-07 DIAGNOSIS — O480 Post-term pregnancy: Secondary | ICD-10-CM

## 2014-01-07 DIAGNOSIS — Z8249 Family history of ischemic heart disease and other diseases of the circulatory system: Secondary | ICD-10-CM

## 2014-01-07 DIAGNOSIS — O9902 Anemia complicating childbirth: Secondary | ICD-10-CM

## 2014-01-07 DIAGNOSIS — B86 Scabies: Secondary | ICD-10-CM

## 2014-01-07 DIAGNOSIS — E669 Obesity, unspecified: Secondary | ICD-10-CM | POA: Diagnosis present

## 2014-01-07 DIAGNOSIS — F5089 Other specified eating disorder: Secondary | ICD-10-CM

## 2014-01-07 DIAGNOSIS — O9989 Other specified diseases and conditions complicating pregnancy, childbirth and the puerperium: Secondary | ICD-10-CM

## 2014-01-07 DIAGNOSIS — O99892 Other specified diseases and conditions complicating childbirth: Secondary | ICD-10-CM

## 2014-01-07 DIAGNOSIS — O99214 Obesity complicating childbirth: Secondary | ICD-10-CM

## 2014-01-07 DIAGNOSIS — Z6838 Body mass index (BMI) 38.0-38.9, adult: Secondary | ICD-10-CM

## 2014-01-07 DIAGNOSIS — Z87891 Personal history of nicotine dependence: Secondary | ICD-10-CM

## 2014-01-07 LAB — CBC
HCT: 29.8 % — ABNORMAL LOW (ref 36.0–46.0)
Hemoglobin: 9.1 g/dL — ABNORMAL LOW (ref 12.0–15.0)
MCH: 22.8 pg — AB (ref 26.0–34.0)
MCHC: 30.5 g/dL (ref 30.0–36.0)
MCV: 74.5 fL — ABNORMAL LOW (ref 78.0–100.0)
PLATELETS: 356 10*3/uL (ref 150–400)
RBC: 4 MIL/uL (ref 3.87–5.11)
RDW: 16.1 % — ABNORMAL HIGH (ref 11.5–15.5)
WBC: 21.1 10*3/uL — ABNORMAL HIGH (ref 4.0–10.5)

## 2014-01-07 LAB — ABO/RH: ABO/RH(D): O POS

## 2014-01-07 LAB — RPR

## 2014-01-07 LAB — TYPE AND SCREEN
ABO/RH(D): O POS
Antibody Screen: NEGATIVE

## 2014-01-07 MED ORDER — HYDROMORPHONE HCL 2 MG PO TABS
1.0000 mg | ORAL_TABLET | ORAL | Status: DC | PRN
  Administered 2014-01-07 – 2014-01-08 (×2): 1 mg via ORAL
  Filled 2014-01-07 (×2): qty 1

## 2014-01-07 MED ORDER — ALBUTEROL SULFATE (2.5 MG/3ML) 0.083% IN NEBU
3.0000 mL | INHALATION_SOLUTION | Freq: Four times a day (QID) | RESPIRATORY_TRACT | Status: DC | PRN
Start: 2014-01-07 — End: 2014-01-09

## 2014-01-07 MED ORDER — DIPHENHYDRAMINE HCL 50 MG/ML IJ SOLN: 12.5000 mg | INTRAMUSCULAR | Status: DC | PRN

## 2014-01-07 MED ORDER — PENICILLIN G POTASSIUM 5000000 UNITS IJ SOLR
2.5000 10*6.[IU] | INTRAVENOUS | Status: DC
  Filled 2014-01-07 (×4): qty 2.5

## 2014-01-07 MED ORDER — WITCH HAZEL-GLYCERIN EX PADS: 1.0000 "application " | MEDICATED_PAD | CUTANEOUS | Status: DC | PRN

## 2014-01-07 MED ORDER — ONDANSETRON HCL 4 MG PO TABS
4.0000 mg | ORAL_TABLET | ORAL | Status: DC | PRN
Start: 2014-01-07 — End: 2014-01-09

## 2014-01-07 MED ORDER — PHENYLEPHRINE 40 MCG/ML (10ML) SYRINGE FOR IV PUSH (FOR BLOOD PRESSURE SUPPORT)
80.0000 ug | PREFILLED_SYRINGE | INTRAVENOUS | Status: DC | PRN
  Filled 2014-01-07: qty 2

## 2014-01-07 MED ORDER — LIDOCAINE HCL (PF) 1 % IJ SOLN
30.0000 mL | INTRAMUSCULAR | Status: DC | PRN
  Filled 2014-01-07: qty 30

## 2014-01-07 MED ORDER — FENTANYL 2.5 MCG/ML BUPIVACAINE 1/10 % EPIDURAL INFUSION (WH - ANES): 14.0000 mL/h | INTRAMUSCULAR | Status: DC | PRN

## 2014-01-07 MED ORDER — FENTANYL 2.5 MCG/ML BUPIVACAINE 1/10 % EPIDURAL INFUSION (WH - ANES)
14.0000 mL/h | INTRAMUSCULAR | Status: DC | PRN
  Administered 2014-01-07: 12 mL/h via EPIDURAL

## 2014-01-07 MED ORDER — LANOLIN HYDROUS EX OINT: TOPICAL_OINTMENT | CUTANEOUS | Status: DC | PRN

## 2014-01-07 MED ORDER — SIMETHICONE 80 MG PO CHEW: 80.0000 mg | CHEWABLE_TABLET | ORAL | Status: DC | PRN

## 2014-01-07 MED ORDER — SODIUM CHLORIDE 0.9 % IJ SOLN: 3.0000 mL | INTRAMUSCULAR | Status: DC | PRN

## 2014-01-07 MED ORDER — OXYTOCIN 40 UNITS IN LACTATED RINGERS INFUSION - SIMPLE MED: 62.5000 mL/h | INTRAVENOUS | Status: DC | PRN

## 2014-01-07 MED ORDER — PRENATAL MULTIVITAMIN CH
1.0000 | ORAL_TABLET | Freq: Every day | ORAL | Status: DC
Start: 2014-01-08 — End: 2014-01-09
  Administered 2014-01-08 – 2014-01-09 (×2): 1 via ORAL
  Filled 2014-01-07 (×2): qty 1

## 2014-01-07 MED ORDER — DIBUCAINE 1 % RE OINT: 1.0000 "application " | TOPICAL_OINTMENT | RECTAL | Status: DC | PRN

## 2014-01-07 MED ORDER — TERBUTALINE SULFATE 1 MG/ML IJ SOLN: 0.2500 mg | Freq: Once | INTRAMUSCULAR | Status: DC | PRN

## 2014-01-07 MED ORDER — FENTANYL 2.5 MCG/ML BUPIVACAINE 1/10 % EPIDURAL INFUSION (WH - ANES)
INTRAMUSCULAR | Status: AC
  Filled 2014-01-07: qty 125

## 2014-01-07 MED ORDER — PENICILLIN G POTASSIUM 5000000 UNITS IJ SOLR
5.0000 10*6.[IU] | Freq: Once | INTRAVENOUS | Status: AC
  Administered 2014-01-07: 5 10*6.[IU] via INTRAVENOUS
  Filled 2014-01-07: qty 5

## 2014-01-07 MED ORDER — LACTATED RINGERS IV SOLN: 500.0000 mL | Freq: Once | INTRAVENOUS | Status: DC

## 2014-01-07 MED ORDER — SODIUM CHLORIDE 0.9 % IJ SOLN: 3.0000 mL | Freq: Two times a day (BID) | INTRAMUSCULAR | Status: DC

## 2014-01-07 MED ORDER — ACETAMINOPHEN 160 MG/5ML PO SOLN: 650.0000 mg | ORAL | Status: DC | PRN

## 2014-01-07 MED ORDER — SENNOSIDES-DOCUSATE SODIUM 8.6-50 MG PO TABS
2.0000 | ORAL_TABLET | ORAL | Status: DC
  Administered 2014-01-08: 2 via ORAL
  Filled 2014-01-07 (×2): qty 2

## 2014-01-07 MED ORDER — EPHEDRINE 5 MG/ML INJ
INTRAVENOUS | Status: AC
Start: 2014-01-07 — End: 2014-01-08
  Filled 2014-01-07: qty 4

## 2014-01-07 MED ORDER — BENZOCAINE-MENTHOL 20-0.5 % EX AERO
1.0000 | INHALATION_SPRAY | CUTANEOUS | Status: DC | PRN
Start: 2014-01-07 — End: 2014-01-09
  Administered 2014-01-07: 1 via TOPICAL
  Filled 2014-01-07: qty 56

## 2014-01-07 MED ORDER — ONDANSETRON HCL 4 MG/2ML IJ SOLN: 4.0000 mg | Freq: Four times a day (QID) | INTRAMUSCULAR | Status: DC | PRN

## 2014-01-07 MED ORDER — SODIUM CHLORIDE 0.9 % IV SOLN: 250.0000 mL | INTRAVENOUS | Status: DC | PRN

## 2014-01-07 MED ORDER — IBUPROFEN 600 MG PO TABS
600.0000 mg | ORAL_TABLET | Freq: Four times a day (QID) | ORAL | Status: DC
Start: 2014-01-07 — End: 2014-01-09
  Administered 2014-01-07 – 2014-01-09 (×7): 600 mg via ORAL
  Filled 2014-01-07 (×8): qty 1

## 2014-01-07 MED ORDER — ONDANSETRON HCL 4 MG/2ML IJ SOLN: 4.0000 mg | INTRAMUSCULAR | Status: DC | PRN

## 2014-01-07 MED ORDER — DIPHENHYDRAMINE HCL 25 MG PO CAPS: 25.0000 mg | ORAL_CAPSULE | Freq: Four times a day (QID) | ORAL | Status: DC | PRN

## 2014-01-07 MED ORDER — PHENYLEPHRINE 40 MCG/ML (10ML) SYRINGE FOR IV PUSH (FOR BLOOD PRESSURE SUPPORT)
PREFILLED_SYRINGE | INTRAVENOUS | Status: AC
  Filled 2014-01-07: qty 10

## 2014-01-07 MED ORDER — OXYTOCIN 40 UNITS IN LACTATED RINGERS INFUSION - SIMPLE MED
1.0000 m[IU]/min | INTRAVENOUS | Status: DC
  Administered 2014-01-07: 2 m[IU]/min via INTRAVENOUS

## 2014-01-07 MED ORDER — EPHEDRINE 5 MG/ML INJ
10.0000 mg | INTRAVENOUS | Status: DC | PRN
  Filled 2014-01-07: qty 2

## 2014-01-07 MED ORDER — CITRIC ACID-SODIUM CITRATE 334-500 MG/5ML PO SOLN: 30.0000 mL | ORAL | Status: DC | PRN

## 2014-01-07 MED ORDER — LACTATED RINGERS IV SOLN
INTRAVENOUS | Status: DC
  Administered 2014-01-07: 10:00:00 via INTRAVENOUS

## 2014-01-07 MED ORDER — OXYTOCIN BOLUS FROM INFUSION: 500.0000 mL | INTRAVENOUS | Status: DC

## 2014-01-07 MED ORDER — ZOLPIDEM TARTRATE 5 MG PO TABS: 5.0000 mg | ORAL_TABLET | Freq: Every evening | ORAL | Status: DC | PRN

## 2014-01-07 MED ORDER — IBUPROFEN 600 MG PO TABS
600.0000 mg | ORAL_TABLET | Freq: Four times a day (QID) | ORAL | Status: DC | PRN
Start: 2014-01-07 — End: 2014-01-07

## 2014-01-07 MED ORDER — OXYCODONE-ACETAMINOPHEN 5-325 MG PO TABS: 1.0000 | ORAL_TABLET | ORAL | Status: DC | PRN

## 2014-01-07 MED ORDER — OXYTOCIN 40 UNITS IN LACTATED RINGERS INFUSION - SIMPLE MED
62.5000 mL/h | INTRAVENOUS | Status: DC
  Filled 2014-01-07: qty 1000

## 2014-01-07 MED ORDER — LACTATED RINGERS IV SOLN: 500.0000 mL | INTRAVENOUS | Status: DC | PRN

## 2014-01-07 MED ORDER — LIDOCAINE HCL (PF) 1 % IJ SOLN
INTRAMUSCULAR | Status: DC | PRN
  Administered 2014-01-07: 8 mL

## 2014-01-07 MED ORDER — TETANUS-DIPHTH-ACELL PERTUSSIS 5-2.5-18.5 LF-MCG/0.5 IM SUSP
0.5000 mL | Freq: Once | INTRAMUSCULAR | Status: AC
  Administered 2014-01-08: 0.5 mL via INTRAMUSCULAR
  Filled 2014-01-07: qty 0.5

## 2014-01-07 NOTE — H&P (Signed)
[name] [name] is a 23 y.o. female presenting for post dates induction with pos GBS. Maternal Medical History:  Reason for admission: Admitted for IOL for post dates.  Fetal activity: Perceived fetal activity is normal.   Last perceived fetal movement was within the past hour.      OB History   Grav Para Term Preterm Abortions TAB SAB Ect Mult Living   4 2 2  1  1   2      Past Medical History  Diagnosis Date  . Asthma   . ADHD   . Migraines     with aura (see flashes)  . Pregnant    Past Surgical History  Procedure Laterality Date  . Dilation and curettage of uterus    . Wisdom tooth extraction    . Jaw wired shut    . Penny removed from esophagus     Family History: family history includes COPD in her brother and father; Diabetes in her mother; Hypertension in her brother and father; Liver disease in her mother. Social History:  reports that she quit smoking about 2 years ago. Her smoking use included Cigarettes. She smoked 1.00 pack per day. She has never used smokeless tobacco. She reports that she does not drink alcohol or use illicit drugs.   Prenatal Transfer Tool  Maternal Diabetes: No Genetic Screening: Normal Maternal Ultrasounds/Referrals: Normal Fetal Ultrasounds or other Referrals:  None Maternal Substance Abuse:  No Significant Maternal Medications:  None Significant Maternal Lab Results:  Lab values include: Group B Strep positive Other Comments:  None  Review of Systems  Constitutional: Negative.   HENT: Negative.   Eyes: Negative.   Respiratory: Negative.   Cardiovascular: Negative.   Gastrointestinal: Negative.   Genitourinary: Negative.   Musculoskeletal: Negative.   Skin: Negative.   Neurological: Negative.   Endo/Heme/Allergies: Negative.   Psychiatric/Behavioral: Negative.       Last menstrual period 03/20/2013. Maternal Exam:  Uterine Assessment: none  Abdomen: Patient reports no abdominal tenderness. Fetal presentation:  vertex  Introitus: Normal vulva.   Fetal Exam Fetal Monitor Review: Mode: ultrasound.   Variability: moderate (6-25 bpm).   Pattern: accelerations present.    Fetal State Assessment: Category I - tracings are normal.     Physical Exam  Constitutional: She is oriented to person, place, and time. She appears well-developed and well-nourished.  HENT:  Head: Normocephalic.  Cardiovascular: Normal rate, regular rhythm, normal heart sounds and intact distal pulses.   Respiratory: Effort normal and breath sounds normal.  GI: Soft. Bowel sounds are normal.  Genitourinary: Vagina normal and uterus normal.  Musculoskeletal: Normal range of motion.  Neurological: She is alert and oriented to person, place, and time. She has normal reflexes.  Skin: Skin is warm and dry.  Psychiatric: She has a normal mood and affect. Her behavior is normal. Judgment and thought content normal.    Prenatal labs: ABO, Rh: O/POS/-- (12/22 1554) Antibody: NEG (04/30 0921) Rubella: 1.08 (12/22 1554) RPR: NON REAC (04/30 0921)  HBsAg: NEGATIVE (12/22 1554)  HIV: NONREACTIVE (04/30 0921)  GBS: Detected (05/26 1701)   Assessment/Plan: IUP at 41 wks post dates induction. SVE 3-4/80/-1   LAWSON, MARIE DARLENE 01/07/2014, 9:23 AM

## 2014-01-07 NOTE — Anesthesia Procedure Notes (Signed)
Epidural Patient location during procedure: OB  Preanesthetic Checklist Completed: patient identified, site marked, surgical consent, pre-op evaluation, timeout performed, IV checked, risks and benefits discussed and monitors and equipment checked  Epidural Patient position: sitting Prep: site prepped and draped and DuraPrep Patient monitoring: continuous pulse ox and blood pressure Approach: midline Injection technique: LOR air  Needle:  Needle type: Tuohy  Needle gauge: 17 G Needle length: 9 cm and 9 Needle insertion depth: 6 cm Catheter type: closed end flexible Catheter size: 19 Gauge Catheter at skin depth: 13 cm Test dose: negative  Assessment Events: blood not aspirated, injection not painful, no injection resistance, negative IV test and no paresthesia  Additional Notes Dosing of Epidural:  1st dose, through catheter ............................................Marland Kitchen Xylocaine 30 mg  2nd dose, through catheter, after waiting 3 minutes.......Marland KitchenXylocaine 30 mg   ( 1% Xylo charted as a single dose in Epic Meds for ease of charting; actual dosing was fractionated as above, for saftey's sake)  As each dose occurred, patient was free of IV sx; and patient exhibited no evidence of SA injection.  Patient is more comfortable after epidural dosed. Please see RN's note for documentation of vital signs,and FHR which are stable.  Patient reminded not to try to ambulate with numb legs, and that an RN must be present the 1st time she attempts to get up.

## 2014-01-07 NOTE — Progress Notes (Signed)
[name] [name] is a 23 y.o. [phone] at [redacted]w[redacted]d by ultrasound admitted for induction of labor due to Post dates. Due date 6/16.  Subjective:   Objective: BP 90/53  Pulse 88  Temp(Src) 97.8 F (36.6 C) (Oral)  Resp 16  Ht 5' (1.524 m)  Wt 198 lb (89.812 kg)  BMI 38.67 kg/m2  LMP 03/20/2013      FHT:  FHR: 130 bpm, variability: moderate,  accelerations:  Present,  decelerations:  Absent UC:   regular, every 2 minutes SVE:   Dilation: 4 Effacement (%): 80 Station: -2 Exam by:: lawson, cnm  Labs: Lab Results  Component Value Date   WBC 21.1* 01/07/2014   HGB 9.1* 01/07/2014   HCT 29.8* 01/07/2014   MCV 74.5* 01/07/2014   PLT 356 01/07/2014    Assessment / Plan: Induction of labor due to postterm,  progressing well on pitocin  Labor: AROM cl fluid noted Preeclampsia:  no signs or symptoms of toxicity Fetal Wellbeing:  Category I Pain Control:  Labor support without medications I/D:  n/a Anticipated MOD:  NSVD  LAWSON, MARIE DARLENE 01/07/2014, 2:42 PM

## 2014-01-07 NOTE — Anesthesia Preprocedure Evaluation (Signed)
Anesthesia Evaluation  Patient identified by MRN, date of birth, ID band Patient awake    Reviewed: Allergy & Precautions, H&P , Patient's Chart, lab work & pertinent test results  Airway Mallampati: II TM Distance: >3 FB Neck ROM: full    Dental  (+) Teeth Intact   Pulmonary asthma , former smoker,  breath sounds clear to auscultation        Cardiovascular Rhythm:regular Rate:Normal     Neuro/Psych    GI/Hepatic   Endo/Other  Morbid obesity  Renal/GU      Musculoskeletal   Abdominal   Peds  Hematology  (+) anemia ,   Anesthesia Other Findings       Reproductive/Obstetrics (+) Pregnancy                           Anesthesia Physical Anesthesia Plan  ASA: III  Anesthesia Plan: Epidural   Post-op Pain Management:    Induction:   Airway Management Planned:   Additional Equipment:   Intra-op Plan:   Post-operative Plan:   Informed Consent: I have reviewed the patients History and Physical, chart, labs and discussed the procedure including the risks, benefits and alternatives for the proposed anesthesia with the patient or authorized representative who has indicated his/her understanding and acceptance.   Dental Advisory Given  Plan Discussed with:   Anesthesia Plan Comments: (Labs checked- platelets confirmed with RN in room. Fetal heart tracing, per RN, reported to be stable enough for sitting procedure. Discussed epidural, and patient consents to the procedure:  included risk of possible headache,backache, failed block, allergic reaction, and nerve injury. This patient was asked if she had any questions or concerns before the procedure started.)        Anesthesia Quick Evaluation

## 2014-01-07 NOTE — Progress Notes (Signed)
[name] [name] is a 23 y.o. [phone] at [redacted]w[redacted]d by ultrasound admitted for induction of labor due to Post dates. Due date 6/16.  Subjective:   Objective: BP 84/43  Pulse 104  Temp(Src) 97.8 F (36.6 C) (Oral)  Resp 20  Ht 5' (1.524 m)  Wt 198 lb (89.812 kg)  BMI 38.67 kg/m2  LMP 03/20/2013      FHT:  FHR: 130 bpm, variability: moderate,  accelerations:  Present,  decelerations:  Absent UC:   regular, every 5 minutes SVE:   Dilation: 3.5 Exam by:: Rockwell Alexandria, CNM  Labs: Lab Results  Component Value Date   WBC 21.1* 01/07/2014   HGB 9.1* 01/07/2014   HCT 29.8* 01/07/2014   MCV 74.5* 01/07/2014   PLT 356 01/07/2014    Assessment / Plan: Induction of labor due to postterm,  progressing well on pitocin  Labor: Progressing on Pitocin, will continue to increase then AROM Preeclampsia:  no signs or symptoms of toxicity Fetal Wellbeing:  Category I Pain Control:  Labor support without medications I/D:  n/a Anticipated MOD:  NSVD  [name] [name] [name] 01/07/2014, 11:01 AM

## 2014-01-08 DIAGNOSIS — B86 Scabies: Secondary | ICD-10-CM

## 2014-01-08 NOTE — Lactation Note (Signed)
This note was copied from the chart of [name] Amana Bouska. Lactation Consultation Note Initial consultation; baby 29 hours old. Mom feeding baby in laid-back position when I enter room. Asked mom if I could assess this feeding, mom accepts. Baby is latched well, mom comfortable. Mom states she squirted breastmilk on accident, however, at this time, there are no audible swallows. Discussed with mom; instructed mom to continue frequent STS and cue based feeding, and to listen for baby to swallow during feedings. Enc mom to call for assistance if she has any concerns.  Mom did not have any questions or concerns at this time.  Reviewed baby and me book breastfeeding your baby, reviewed lactation brochure, community resources and BFSG.  Patient Name: [name] [name] [id number] Date: 01/08/2014 Reason for consult: Initial assessment   Maternal Data Formula Feeding for Exclusion: No Does the patient have breastfeeding experience prior to this delivery?: Yes  Feeding Feeding Type: Breast Fed  LATCH Score/Interventions Latch: Grasps breast easily, tongue down, lips flanged, rhythmical sucking.  Audible Swallowing: None Intervention(s): Skin to skin;Hand expression  Type of Nipple: Everted at rest and after stimulation  Comfort (Breast/Nipple): Soft / non-tender     Hold (Positioning): No assistance needed to correctly position infant at breast.  LATCH Score: 8  Lactation Tools Discussed/Used     Consult Status Consult Status: Follow-up Follow-up type: In-patient    [place] [facility] 01/08/2014, 10:11 AM

## 2014-01-08 NOTE — Anesthesia Postprocedure Evaluation (Signed)
Anesthesia Post Note  Patient: [name] [name]  Procedure(s) Performed: * No procedures listed *  Anesthesia type: Epidural  Patient location: Mother/Baby  Post pain: Pain level controlled  Post assessment: Post-op Vital signs reviewed  Last Vitals:  Filed Vitals:   01/08/14 0030  BP: 98/62  Pulse: 72  Temp:   Resp: 18    Post vital signs: Reviewed  Level of consciousness:alert  Complications: No apparent anesthesia complications

## 2014-01-08 NOTE — Progress Notes (Signed)
Ur chart review completed.  

## 2014-01-08 NOTE — Progress Notes (Signed)
Post Partum Day 1 Subjective: no complaints, up ad lib, voiding and tolerating PO  Objective: Blood pressure 98/62, pulse 72, temperature 98.1 F (36.7 C), temperature source Oral, resp. rate 18, height 5' (1.524 m), weight 89.812 kg (198 lb), last menstrual period 03/20/2013, SpO2 97.00%, unknown if currently breastfeeding.  Physical Exam:  General: alert Lochia: appropriate Uterine Fundus: firm Incision: n/a DVT Evaluation: No evidence of DVT seen on physical exam.   Recent Labs  01/07/14 0932  HGB 9.1*  HCT 29.8*    Assessment/Plan: Plan for discharge tomorrow   LOS: 1 day   DOVE,MYRA C. 01/08/2014, 8:15 AM

## 2014-01-09 MED ORDER — IBUPROFEN 600 MG PO TABS: 600.0000 mg | ORAL_TABLET | Freq: Four times a day (QID) | ORAL | Status: DC

## 2014-01-09 NOTE — Discharge Summary (Signed)
Obstetric Discharge Summary Reason for Admission: induction of labor for postdates Prenatal Procedures: NST Intrapartum Procedures: spontaneous vaginal delivery Postpartum Procedures: none Complications-Operative and Postpartum: none Hemoglobin  Date Value Ref Range Status  01/07/2014 9.1* 12.0 - 15.0 g/dL Final     HCT  Date Value Ref Range Status  01/07/2014 29.8* 36.0 - 46.0 % Final   Delivery Note At 4:29 PM a viable female was delivered via  (Presentation: ;  ).  APGAR:8 , 8; weight .   Placenta status:spont , . 3vc Cord:  with the following complications: none.  Cord pH: n/a  Anesthesia: Epidural  Episiotomy: none Lacerations: none Suture Repair: n/a Est. Blood Loss (mL):   Mom to postpartum.  Baby to Couplet care / Skin to Skin.  Physical Exam:  General: alert and cooperative Lochia: appropriate Uterine Fundus: firm Incision: na DVT Evaluation: No evidence of DVT seen on physical exam. No significant calf/ankle edema.  Discharge Diagnoses: Term Pregnancy-delivered  Discharge Information: Date: 01/09/2014 Activity: unrestricted Diet: routine Medications: PNV and Ibuprofen Condition: stable Instructions: refer to practice specific booklet Discharge to: home Follow-up Information   Follow up with [facility] In 5 weeks.   Specialty:  Obstetrics and Gynecology   Contact information:   [place] [address] [phone]      Newborn Data: Live born female  Birth Weight: 8 lb 9.6 oz (3901 g) APGAR: 8, 8  Home with mother.  Pt presented for IOL for postdates and progressed to deliver a liveborn female as above. Plans mirena for birth control. Breast feeding.    BECK, KELI L 01/09/2014, 11:22 AM

## 2014-01-09 NOTE — Discharge Instructions (Signed)
Vaginal Delivery Care After Refer to this sheet in the next few weeks. These discharge instructions provide you with information on caring for yourself after delivery. Your caregiver may also give you specific instructions. Your treatment has been planned according to the most current medical practices available, but problems sometimes occur. Call your caregiver if you have any problems or questions after you go home. HOME CARE INSTRUCTIONS  Take over-the-counter or prescription medicines only as directed by your caregiver or pharmacist.  Do not drink alcohol, especially if you are breastfeeding or taking medicine to relieve pain.  Do not chew or smoke tobacco.  Do not use illegal drugs.  Continue to use good perineal care. Good perineal care includes:  Wiping your perineum from front to back.  Keeping your perineum clean.  Do not use tampons or douche until your caregiver says it is okay.  Shower, wash your hair, and take tub baths as directed by your caregiver.  Wear a well-fitting bra that provides breast support.  Eat healthy foods.  Drink enough fluids to keep your urine clear or pale yellow.  Eat high-fiber foods such as whole grain cereals and breads, brown rice, beans, and fresh fruits and vegetables every day. These foods may help prevent or relieve constipation.  Follow your cargiver's recommendations regarding resumption of activities such as climbing stairs, driving, lifting, exercising, or traveling.  Talk to your caregiver about resuming sexual activities. Resumption of sexual activities is dependent upon your risk of infection, your rate of healing, and your comfort and desire to resume sexual activity.  Try to have someone help you with your household activities and your newborn for at least a few days after you leave the hospital.  Rest as much as possible. Try to rest or take a nap when your newborn is sleeping.  Increase your activities gradually.  Keep all  of your scheduled postpartum appointments. It is very important to keep your scheduled follow-up appointments. At these appointments, your caregiver will be checking to make sure that you are healing physically and emotionally. SEEK MEDICAL CARE IF:   You are passing large clots from your vagina. Save any clots to show your caregiver.  You have a foul smelling discharge from your vagina.  You have trouble urinating.  You are urinating frequently.  You have pain when you urinate.  You have a change in your bowel movements.  You have increasing redness, pain, or swelling near your vaginal incision (episiotomy) or vaginal tear.  You have pus draining from your episiotomy or vaginal tear.  Your episiotomy or vaginal tear is separating.  You have painful, hard, or reddened breasts.  You have a severe headache.  You have blurred vision or see spots.  You feel sad or depressed.  You have thoughts of hurting yourself or your newborn.  You have questions about your care, the care of your newborn, or medicines.  You are dizzy or lightheaded.  You have a rash.  You have nausea or vomiting.  You were breastfeeding and have not had a menstrual period within 12 weeks after you stopped breastfeeding.  You are not breastfeeding and have not had a menstrual period by the 12th week after delivery.  You have a fever. SEEK IMMEDIATE MEDICAL CARE IF:   You have persistent pain.  You have chest pain.  You have shortness of breath.  You faint.  You have leg pain.  You have stomach pain.  Your vaginal bleeding saturates two or more sanitary pads  in 1 hour. MAKE SURE YOU:   Understand these instructions.  Will watch your condition.  Will get help right away if you are not doing well or get worse. Document Released: 07/03/2000 Document Revised: 03/30/2012 Document Reviewed: 03/02/2012 [facility] Patient Information 2015 [place], [place]. This information is not intended to  replace advice given to you by your health care provider. Make sure you discuss any questions you have with your health care provider.
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
## 2014-01-10 NOTE — Discharge Summary (Signed)
Attestation of Attending Supervision of Advanced Practitioner (CNM/NP): Evaluation and management procedures were performed by the Advanced Practitioner under my supervision and collaboration.  I have reviewed the Advanced Practitioner's note and chart, and I agree with the management and plan.  Meiko Ives 01/10/2014 11:02 AM

## 2014-01-11 ENCOUNTER — Telehealth: Payer: Self-pay | Admitting: *Deleted

## 2014-01-11 NOTE — Telephone Encounter (Signed)
Pt called stating that she had a SVD on 01/07/14 and was d/c'd from [facility] on Tuesday. Since she has came home from the hospital the pt states that she feels really bad, and that the bleeding had slowed down to just a orange discharge but passed a big clot today. The pt stated that she had not checked to see if she was still having heavy bleeding but it felt like it. The pt denied any odor, fever or chills, pt's temp was 98.9. The pt stated that she had some abdominal soreness that has gotten worse. I advised the pt that everything sounded normal but to watch everything and if she started having severe pain or heavier bleeding to go straight to [facility].The pt then stated that the Ibuprofen that she was given when she left the hospital was not helping the pain at all, it only helped the cramping. The pt was given an appointment for tomorrow at 8:45 with Dr. Elonda Husky. THe pt was also advised to push her fluids, eat red meats and green veggies and to take her PNV, and to also continue taking the Ibuprofen until she is seen tomorrow.  Pt verbalized understanding.

## 2014-01-12 ENCOUNTER — Ambulatory Visit (INDEPENDENT_AMBULATORY_CARE_PROVIDER_SITE_OTHER): Payer: Medicaid Other | Admitting: Obstetrics & Gynecology

## 2014-01-12 ENCOUNTER — Encounter: Payer: Self-pay | Admitting: Obstetrics & Gynecology

## 2014-01-12 VITALS — BP 110/70 | Wt 187.0 lb

## 2014-01-12 DIAGNOSIS — O9089 Other complications of the puerperium, not elsewhere classified: Secondary | ICD-10-CM

## 2014-01-12 MED ORDER — METHYLERGONOVINE MALEATE 0.2 MG PO TABS: ORAL_TABLET | ORAL | Status: DC

## 2014-01-12 MED ORDER — HYDROCODONE-ACETAMINOPHEN 5-325 MG PO TABS: 1.0000 | ORAL_TABLET | Freq: Four times a day (QID) | ORAL | Status: DC | PRN

## 2014-01-12 NOTE — Progress Notes (Signed)
   Subjective:    Patient ID: [name] [name], female    DOB: 04/04/1991, 23 y.o.   MRN: [id number] Blood pressure 110/70, weight 187 lb (84.823 kg), unknown if currently breastfeeding.   HPI Pt presents complaining of large clot passed last night with lower pelvic and back crampy pain, delivered 3 days ago breasfeeding Unremarkable labor, reviewed   Review of Systems No burning with urination, frequency or urgency No nausea, vomiting or diarrhea Nor fever chills or other constitutional symptoms     Objective:   Physical Exam  Back negative Abdomen soft benign NEFG Uterus just a bit below umbilicus      Assessment & Plan:  Uterine subinvolution without evidence of endometritis:  Methergine 0.2 mg 4 times a day for 2 days and hydrocodone

## 2014-01-25 ENCOUNTER — Emergency Department (HOSPITAL_COMMUNITY)
Admission: EM | Admit: 2014-01-25 | Discharge: 2014-01-25 | Disposition: A | Discharge status: Home / Self Care | Payer: Medicaid Other | Attending: Emergency Medicine | Admitting: Emergency Medicine

## 2014-01-25 ENCOUNTER — Encounter (HOSPITAL_COMMUNITY): Payer: Self-pay | Admitting: Emergency Medicine

## 2014-01-25 DIAGNOSIS — O26899 Other specified pregnancy related conditions, unspecified trimester: Secondary | ICD-10-CM | POA: Insufficient documentation

## 2014-01-25 DIAGNOSIS — O8612 Endometritis following delivery: Secondary | ICD-10-CM | POA: Diagnosis present

## 2014-01-25 DIAGNOSIS — O909 Complication of the puerperium, unspecified: Secondary | ICD-10-CM | POA: Diagnosis not present

## 2014-01-25 DIAGNOSIS — Z79899 Other long term (current) drug therapy: Secondary | ICD-10-CM | POA: Diagnosis not present

## 2014-01-25 DIAGNOSIS — G43909 Migraine, unspecified, not intractable, without status migrainosus: Secondary | ICD-10-CM | POA: Insufficient documentation

## 2014-01-25 DIAGNOSIS — Z9889 Other specified postprocedural states: Secondary | ICD-10-CM | POA: Insufficient documentation

## 2014-01-25 DIAGNOSIS — Z87891 Personal history of nicotine dependence: Secondary | ICD-10-CM | POA: Diagnosis not present

## 2014-01-25 DIAGNOSIS — Z792 Long term (current) use of antibiotics: Secondary | ICD-10-CM | POA: Diagnosis not present

## 2014-01-25 DIAGNOSIS — J45909 Unspecified asthma, uncomplicated: Secondary | ICD-10-CM | POA: Insufficient documentation

## 2014-01-25 DIAGNOSIS — N719 Inflammatory disease of uterus, unspecified: Secondary | ICD-10-CM

## 2014-01-25 DIAGNOSIS — Z8659 Personal history of other mental and behavioral disorders: Secondary | ICD-10-CM | POA: Diagnosis not present

## 2014-01-25 LAB — CBC WITH DIFFERENTIAL/PLATELET
Basophils Absolute: 0.1 10*3/uL (ref 0.0–0.1)
Basophils Relative: 0 % (ref 0–1)
Eosinophils Absolute: 0.2 10*3/uL (ref 0.0–0.7)
Eosinophils Relative: 1 % (ref 0–5)
HEMATOCRIT: 37.1 % (ref 36.0–46.0)
HEMOGLOBIN: 11.5 g/dL — AB (ref 12.0–15.0)
LYMPHS ABS: 2.5 10*3/uL (ref 0.7–4.0)
Lymphocytes Relative: 15 % (ref 12–46)
MCH: 23.2 pg — ABNORMAL LOW (ref 26.0–34.0)
MCHC: 31 g/dL (ref 30.0–36.0)
MCV: 74.9 fL — ABNORMAL LOW (ref 78.0–100.0)
MONO ABS: 0.7 10*3/uL (ref 0.1–1.0)
MONOS PCT: 4 % (ref 3–12)
NEUTROS ABS: 13 10*3/uL — AB (ref 1.7–7.7)
NEUTROS PCT: 80 % — AB (ref 43–77)
Platelets: 317 10*3/uL (ref 150–400)
RBC: 4.95 MIL/uL (ref 3.87–5.11)
RDW: 17.8 % — ABNORMAL HIGH (ref 11.5–15.5)
WBC: 16.4 10*3/uL — ABNORMAL HIGH (ref 4.0–10.5)

## 2014-01-25 LAB — WET PREP, GENITAL
Trich, Wet Prep: NONE SEEN
YEAST WET PREP: NONE SEEN

## 2014-01-25 LAB — URINALYSIS, ROUTINE W REFLEX MICROSCOPIC
BILIRUBIN URINE: NEGATIVE
Glucose, UA: NEGATIVE mg/dL
Hgb urine dipstick: NEGATIVE
Ketones, ur: NEGATIVE mg/dL
Leukocytes, UA: NEGATIVE
Nitrite: NEGATIVE
PROTEIN: NEGATIVE mg/dL
SPECIFIC GRAVITY, URINE: 1.02 (ref 1.005–1.030)
UROBILINOGEN UA: 1 mg/dL (ref 0.0–1.0)
pH: 6 (ref 5.0–8.0)

## 2014-01-25 MED ORDER — METRONIDAZOLE 500 MG PO TABS
500.0000 mg | ORAL_TABLET | Freq: Once | ORAL | Status: AC
  Administered 2014-01-25: 500 mg via ORAL
  Filled 2014-01-25: qty 1

## 2014-01-25 MED ORDER — ONDANSETRON 4 MG PO TBDP
4.0000 mg | ORAL_TABLET | Freq: Once | ORAL | Status: AC
  Administered 2014-01-25: 4 mg via ORAL
  Filled 2014-01-25: qty 1

## 2014-01-25 MED ORDER — METRONIDAZOLE 500 MG PO TABS: 500.0000 mg | ORAL_TABLET | Freq: Three times a day (TID) | ORAL | Status: DC

## 2014-01-25 MED ORDER — AMOXICILLIN-POT CLAVULANATE 875-125 MG PO TABS
1.0000 | ORAL_TABLET | Freq: Once | ORAL | Status: AC
Start: 2014-01-25 — End: 2014-01-25
  Administered 2014-01-25: 1 via ORAL
  Filled 2014-01-25: qty 1

## 2014-01-25 MED ORDER — HYDROCODONE-ACETAMINOPHEN 5-325 MG PO TABS
1.0000 | ORAL_TABLET | Freq: Once | ORAL | Status: AC
  Administered 2014-01-25: 1 via ORAL
  Filled 2014-01-25: qty 1

## 2014-01-25 MED ORDER — AMOXICILLIN-POT CLAVULANATE 875-125 MG PO TABS: 1.0000 | ORAL_TABLET | Freq: Two times a day (BID) | ORAL | Status: DC

## 2014-01-25 MED ORDER — HYDROCODONE-ACETAMINOPHEN 5-325 MG PO TABS: ORAL_TABLET | ORAL | Status: DC

## 2014-01-25 NOTE — ED Provider Notes (Signed)
CSN: [id number]     Arrival date & time 01/25/14  0540 History   First MD Initiated Contact with Patient 01/25/14 [phone]     No chief complaint on file.     HPI  Patient presents with lower abdominal pain. Had a spontaneous post dates vaginal delivery on June 21. No complications. They have heavy bleeding on postpartum day for and received 2 days of Methergine. Morning she awakened at 3 him some lower abdominal cramps. She states it felt like labor "not as bad. Mild nausea. Pain is improved. She was still having bleeding until yesterday. Has noticed some thin yellow discharge today. No fever. No bleeding today. Not lightheaded or dizzy.  Past Medical History  Diagnosis Date  . Asthma   . ADHD   . Migraines     with aura (see flashes)  . Pregnant    Past Surgical History  Procedure Laterality Date  . Dilation and curettage of uterus    . Wisdom tooth extraction    . Jaw wired shut    . 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 Diabetes Mother   . Liver disease Mother     fatty liver   . Hypertension Father   . COPD Father   . Hypertension Brother   . COPD Brother    History  Substance Use Topics  . Smoking status: Former Smoker -- 1.00 packs/day    Types: Cigarettes    Quit date: 01/08/2012  . Smokeless tobacco: Never Used  . Alcohol Use: No   OB History   Grav Para Term Preterm Abortions TAB SAB Ect Mult Living   4 3 3  1  1   3      Review of Systems  Constitutional: Negative for fever, chills, diaphoresis, appetite change and fatigue.  HENT: Negative for mouth sores, sore throat and trouble swallowing.   Eyes: Negative for visual disturbance.  Respiratory: Negative for cough, chest tightness, shortness of breath and wheezing.   Cardiovascular: Negative for chest pain.  Gastrointestinal: Negative for nausea, vomiting, abdominal pain, diarrhea and abdominal distention.  Endocrine: Negative for polydipsia, polyphagia and  polyuria.  Genitourinary: Positive for vaginal bleeding, vaginal discharge and pelvic pain. Negative for dysuria, frequency, hematuria, decreased urine volume, genital sores and vaginal pain.  Musculoskeletal: Negative for gait problem.  Skin: Negative for color change, pallor and rash.  Neurological: Negative for dizziness, syncope, light-headedness and headaches.  Hematological: Does not bruise/bleed easily.  Psychiatric/Behavioral: Negative for behavioral problems and confusion.      Allergies  Darvocet; Imitrex; Adhesive; Codeine; Percocet; and Sulfa antibiotics  Home Medications   Prior to Admission medications   Medication Sig Start Date End Date Taking? Authorizing Provider  albuterol (PROVENTIL HFA;VENTOLIN HFA) 108 (90 BASE) MCG/ACT inhaler Inhale into the lungs every 6 (six) hours as needed for wheezing or shortness of breath.    Historical Provider, MD  amoxicillin-clavulanate (AUGMENTIN) 875-125 MG per tablet Take 1 tablet by mouth 2 (two) times daily. 01/25/14   Tanna Furry, MD  HYDROcodone-acetaminophen (NORCO/VICODIN) 5-325 MG per tablet Take 1 tablet by mouth every 6 (six) hours as needed. 01/12/14   Florian Buff, MD  HYDROcodone-acetaminophen (NORCO/VICODIN) 5-325 MG per tablet 1 po q 6 hours as needed for pain 01/25/14   Tanna Furry, MD  ibuprofen (ADVIL,MOTRIN) 600 MG tablet Take 1 tablet (600 mg total) by mouth every 6 (six) hours. 01/09/14   Kassie Mends, MD  methylergonovine (METHERGINE) 0.2 MG tablet 1 tablet 4 times a day for 2 days 01/12/14   Florian Buff, MD  metroNIDAZOLE (FLAGYL) 500 MG tablet Take 1 tablet (500 mg total) by mouth 3 (three) times daily. 01/25/14   Tanna Furry, MD  Pediatric Multiple Vit-C-FA (FLINSTONES GUMMIES OMEGA-3 DHA PO) Take 2 tablets by mouth daily.     Historical Provider, MD   BP 97/56  Pulse 89  Temp(Src) 98 F (36.7 C) (Oral)  Resp 16  Ht 5\' 1"  (1.549 m)  Wt 177 lb (80.287 kg)  BMI 33.46 kg/m2 Physical Exam  Constitutional: She is  oriented to person, place, and time. She appears well-developed and well-nourished. No distress.  HENT:  Head: Normocephalic.  Eyes: Conjunctivae are normal. Pupils are equal, round, and reactive to light. No scleral icterus.  Neck: Normal range of motion. Neck supple. No thyromegaly present.  Cardiovascular: Normal rate and regular rhythm.  Exam reveals no gallop and no friction rub.   No murmur heard. Pulmonary/Chest: Effort normal and breath sounds normal. No respiratory distress. She has no wheezes. She has no rales.  Abdominal: Soft. Bowel sounds are normal. She exhibits no distension. There is no tenderness. There is no rebound.    Genitourinary:    Musculoskeletal: Normal range of motion.  Neurological: She is alert and oriented to person, place, and time.  Skin: Skin is warm and dry. No rash noted.  Psychiatric: She has a normal mood and affect. Her behavior is normal.    ED Course  Procedures (including critical care time) Labs Review Labs Reviewed  WET PREP, GENITAL - Abnormal; Notable for the following:    Clue Cells Wet Prep HPF POC MANY (*)    WBC, Wet Prep HPF POC MANY (*)    All other components within normal limits  URINE CULTURE  GC/CHLAMYDIA PROBE AMP  URINALYSIS, ROUTINE W REFLEX MICROSCOPIC  CBC WITH DIFFERENTIAL    Imaging Review No results found.   EKG Interpretation None      MDM   Final diagnoses:  Endometritis    Dam consistent with endometritis. Urine does not show infection. Not bleeding. I doubt retained products. Plan will be Flagyl, Augmentin, limited pain medication as she is breast-feeding and given prescription for Vicodin asked her to take this only every 6-8 hours. GYN followup.    Tanna Furry, MD 01/25/14 [phone]

## 2014-01-25 NOTE — ED Notes (Signed)
Pt had vaginal delivery on June 21, and the baby is doing fine.  Pt states she awoke approx 3 am with severe lower abd pain, sharp in nature.  Pt is nauseated, but no vomiting or diarrhea

## 2014-01-25 NOTE — ED Notes (Signed)
Pt is still having some vaginal discharge/bleeding since vaginal delivery

## 2014-01-25 NOTE — Discharge Instructions (Signed)
Endometritis Endometritis is an irritation, soreness, and swelling (inflammation) of the lining of the uterus (endometrium).  CAUSES   Bacterial infections.  Sexually transmitted infections (STIs).  Having a miscarriage or childbirth, especially after a long labor or cesarean delivery.  Certain gynecological procedures (such as dilation and curettage, hysteroscopy, or contraceptive insertion). SIGNS AND SYMPTOMS   Fever.  Lower abdominal or pelvic pain.  Abnormal vaginal discharge or bleeding.  Abdominal bloating (distention) or swelling.  General discomfort or ill feeling.  Discomfort with bowel movements. DIAGNOSIS  A physical and pelvic exam are performed. Other tests may include:  Cultures from the cervix.  Blood tests.  Examining a tissue sample of the uterine lining (endometrial biopsy).  Examining discharge under a microscope (wet prep).  Laparoscopy. TREATMENT  Antibiotic medicines are usually given. Other treatments may include:  Fluids through an IV tube inserted in your vein.  Rest. HOME CARE INSTRUCTIONS   Take over-the-counter or prescription medicines for pain, discomfort, or fever as directed by your health care provider.  Take your antibiotics as directed. Finish them even if you start to feel better.  Resume your normal diet and activities as directed or as tolerated.  Do not douche or have sexual intercourse until your health care provider says it is okay.  Do not have sexual intercourse until your partner has been treated if your endometritis is caused by an STI. SEEK IMMEDIATE MEDICAL CARE IF:   You have swelling or increasing pain in the abdomen.  You have a fever.  You have bad smelling vaginal discharge, or you have an increased amount of discharge.  You have abnormal vaginal bleeding.  Your medicine is not helping with the pain.  You experience any problems that may be related to the medicine you are taking.  You have nausea  and vomiting, or you cannot keep foods down.  You have pain with bowel movements. MAKE SURE YOU:   Understand these instructions.  Will watch your condition.  Will get help right away if you are not doing well or get worse. Document Released: 06/30/2001 Document Revised: 03/08/2013 Document Reviewed: 02/02/2013 [employer] Patient Information 2015 [employer]. This information is not intended to replace advice given to you by your health care provider. Make sure you discuss any questions you have with your health care provider.  

## 2014-01-26 LAB — URINE CULTURE
Colony Count: NO GROWTH
Culture: NO GROWTH

## 2014-01-26 LAB — GC/CHLAMYDIA PROBE AMP
CT Probe RNA: NEGATIVE
GC PROBE AMP APTIMA: NEGATIVE

## 2014-02-13 ENCOUNTER — Ambulatory Visit: Payer: Medicaid Other | Admitting: Women's Health

## 2014-02-19 ENCOUNTER — Encounter (HOSPITAL_COMMUNITY): Payer: Self-pay | Admitting: Emergency Medicine

## 2014-02-19 ENCOUNTER — Emergency Department (HOSPITAL_COMMUNITY)
Admission: EM | Admit: 2014-02-19 | Discharge: 2014-02-19 | Disposition: A | Discharge status: Home / Self Care | Payer: Medicaid Other | Attending: Emergency Medicine | Admitting: Emergency Medicine

## 2014-02-19 DIAGNOSIS — Z79899 Other long term (current) drug therapy: Secondary | ICD-10-CM | POA: Insufficient documentation

## 2014-02-19 DIAGNOSIS — H5710 Ocular pain, unspecified eye: Secondary | ICD-10-CM | POA: Diagnosis present

## 2014-02-19 DIAGNOSIS — H18829 Corneal disorder due to contact lens, unspecified eye: Secondary | ICD-10-CM | POA: Insufficient documentation

## 2014-02-19 DIAGNOSIS — G43909 Migraine, unspecified, not intractable, without status migrainosus: Secondary | ICD-10-CM | POA: Diagnosis not present

## 2014-02-19 DIAGNOSIS — Z8659 Personal history of other mental and behavioral disorders: Secondary | ICD-10-CM | POA: Insufficient documentation

## 2014-02-19 DIAGNOSIS — J45909 Unspecified asthma, uncomplicated: Secondary | ICD-10-CM | POA: Insufficient documentation

## 2014-02-19 DIAGNOSIS — H18823 Corneal disorder due to contact lens, bilateral: Secondary | ICD-10-CM

## 2014-02-19 DIAGNOSIS — F17200 Nicotine dependence, unspecified, uncomplicated: Secondary | ICD-10-CM | POA: Diagnosis not present

## 2014-02-19 MED ORDER — KETOROLAC TROMETHAMINE 0.5 % OP SOLN
1.0000 [drp] | Freq: Once | OPHTHALMIC | Status: AC
  Administered 2014-02-19: 1 [drp] via OPHTHALMIC
  Filled 2014-02-19: qty 5

## 2014-02-19 MED ORDER — TETRACAINE HCL 0.5 % OP SOLN
1.0000 [drp] | Freq: Once | OPHTHALMIC | Status: DC
  Filled 2014-02-19: qty 2

## 2014-02-19 MED ORDER — FLUORESCEIN SODIUM 1 MG OP STRP
1.0000 | ORAL_STRIP | Freq: Once | OPHTHALMIC | Status: DC
  Filled 2014-02-19: qty 1

## 2014-02-19 MED ORDER — TOBRAMYCIN 0.3 % OP SOLN
1.0000 [drp] | Freq: Once | OPHTHALMIC | Status: AC
  Administered 2014-02-19: 1 [drp] via OPHTHALMIC
  Filled 2014-02-19: qty 5

## 2014-02-19 NOTE — ED Notes (Signed)
Patient complaining of bilateral eye pain due to sleeping in her contacts last night. States "it has been hurting since I woke up this morning."

## 2014-02-19 NOTE — Discharge Instructions (Signed)
Corneal Abrasion The cornea is the clear covering at the front and center of the eye. When looking at the colored portion of the eye (iris), you are looking through the cornea. This very thin tissue is made up of many layers. The surface layer is a single layer of cells (corneal epithelium) and is one of the most sensitive tissues in the body. If a scratch or injury causes the corneal epithelium to come off, it is called a corneal abrasion. If the injury extends to the tissues below the epithelium, the condition is called a corneal ulcer. CAUSES   Scratches.  Trauma.  Foreign body in the eye. Some people have recurrences of abrasions in the area of the original injury even after it has healed (recurrent erosion syndrome). Recurrent erosion syndrome generally improves and goes away with time. SYMPTOMS   Eye pain.  Difficulty or inability to keep the injured eye open.  The eye becomes very sensitive to light.  Recurrent erosions tend to happen suddenly, first thing in the morning, usually after waking up and opening the eye. DIAGNOSIS  Your health care provider can diagnose a corneal abrasion during an eye exam. Dye is usually placed in the eye using a drop or a small paper strip moistened by your tears. When the eye is examined with a special light, the abrasion shows up clearly because of the dye. TREATMENT   Small abrasions may be treated with antibiotic drops or ointment alone.  A pressure patch may be put over the eye. If this is done, follow your doctor's instructions for when to remove the patch. Do not drive or use machines while the eye patch is on. Judging distances is hard to do with a patch on. If the abrasion becomes infected and spreads to the deeper tissues of the cornea, a corneal ulcer can result. This is serious because it can cause corneal scarring. Corneal scars interfere with light passing through the cornea and cause a loss of vision in the involved eye. HOME CARE  INSTRUCTIONS  Use medicine or ointment as directed. Only take over-the-counter or prescription medicines for pain, discomfort, or fever as directed by your health care provider.  Do not drive or operate machinery if your eye is patched. Your ability to judge distances is impaired.  If your health care provider has given you a follow-up appointment, it is very important to keep that appointment. Not keeping the appointment could result in a severe eye infection or permanent loss of vision. If there is any problem keeping the appointment, let your health care provider know. SEEK MEDICAL CARE IF:   You have pain, light sensitivity, and a scratchy feeling in one eye or both eyes.  Your pressure patch keeps loosening up, and you can blink your eye under the patch after treatment.  Any kind of discharge develops from the eye after treatment or if the lids stick together in the morning.  You have the same symptoms in the morning as you did with the original abrasion days, weeks, or months after the abrasion healed. MAKE SURE YOU:   Understand these instructions.  Will watch your condition.  Will get help right away if you are not doing well or get worse. Document Released: 07/03/2000 Document Revised: 07/11/2013 Document Reviewed: 03/13/2013 [facility] Patient Information 2015 [place], [place]. This information is not intended to replace advice given to you by your health care provider. Make sure you discuss any questions you have with your health care provider.   Apply  one drop of each medicine in your eyes every 4 hours while awake for the next 5 days, or until symptoms are resolved.  Call Dr. Iona Hansen for an office recheck if you are improving by Wednesday.

## 2014-02-21 NOTE — ED Provider Notes (Signed)
CSN: [id number]     Arrival date & time 02/19/14  2011 History   First MD Initiated Contact with Patient 02/19/14 2129     Chief Complaint  Patient presents with  . Eye Pain     (Consider location/radiation/quality/duration/timing/severity/associated sxs/prior Treatment) The history is provided by the patient.   [name] [name] is a 23 y.o. female presenting with bilateral eye pain and light sensitivity since waking this morning.  She accidentally fell asleep with contacts in (cosmetic only, not corrective) last night causing discomfort today.  She denies decreased visual acuity and denies sensation of foreign body, describes generalized eye irritation and difficulty keeping her eyes open, especially in bright light.  There has been no drainage from her eyes.  She denies headache, fevers or chills.  She has found no alleviators for her symptoms.     Past Medical History  Diagnosis Date  . Asthma   . ADHD   . Migraines     with aura (see flashes)  . Pregnant    Past Surgical History  Procedure Laterality Date  . Dilation and curettage of uterus    . Wisdom tooth extraction    . Jaw wired shut    . 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 Diabetes Mother   . Liver disease Mother     fatty liver   . Hypertension Father   . COPD Father   . Hypertension Brother   . COPD Brother    History  Substance Use Topics  . Smoking status: Current Some Day Smoker -- 1.00 packs/day    Types: Cigarettes    Last Attempt to Quit: 01/08/2012  . Smokeless tobacco: Never Used  . Alcohol Use: No   OB History   Grav Para Term Preterm Abortions TAB SAB Ect Mult Living   4 3 3  1  1   3      Review of Systems  Constitutional: Negative for fever.  HENT: Negative for congestion, ear pain, facial swelling and sore throat.   Eyes: Positive for photophobia and pain. Negative for discharge and visual disturbance.  Respiratory: Negative.    Cardiovascular: Negative.   Gastrointestinal: Negative for nausea.  Genitourinary: Negative.   Skin: Negative.  Negative for rash and wound.  Neurological: Negative for dizziness, weakness and headaches.  Psychiatric/Behavioral: Negative.       Allergies  Darvocet; Imitrex; Adhesive; Codeine; Percocet; and Sulfa antibiotics  Home Medications   Prior to Admission medications   Medication Sig Start Date End Date Taking? Authorizing Provider  albuterol (PROVENTIL HFA;VENTOLIN HFA) 108 (90 BASE) MCG/ACT inhaler Inhale into the lungs every 6 (six) hours as needed for wheezing or shortness of breath.    Historical Provider, MD  amoxicillin-clavulanate (AUGMENTIN) 875-125 MG per tablet Take 1 tablet by mouth 2 (two) times daily. 01/25/14   Tanna Furry, MD  HYDROcodone-acetaminophen (NORCO/VICODIN) 5-325 MG per tablet Take 1 tablet by mouth every 6 (six) hours as needed. 01/12/14   Florian Buff, MD  HYDROcodone-acetaminophen (NORCO/VICODIN) 5-325 MG per tablet 1 po q 6 hours as needed for pain 01/25/14   Tanna Furry, MD  ibuprofen (ADVIL,MOTRIN) 600 MG tablet Take 1 tablet (600 mg total) by mouth every 6 (six) hours. 01/09/14   Kassie Mends, MD  methylergonovine (METHERGINE) 0.2 MG tablet 1 tablet 4 times a day for 2 days 01/12/14   Florian Buff, MD  metroNIDAZOLE (FLAGYL) 500 MG tablet Take  1 tablet (500 mg total) by mouth 3 (three) times daily. 01/25/14   Tanna Furry, MD  Pediatric Multiple Vit-C-FA (FLINSTONES GUMMIES OMEGA-3 DHA PO) Take 2 tablets by mouth daily.     Historical Provider, MD   BP 98/59  Pulse 70  Temp(Src) 98.8 F (37.1 C) (Oral)  Resp 20  Ht 5\' 3"  (1.6 m)  Wt 172 lb (78.019 kg)  BMI 30.48 kg/m2  SpO2 99%  LMP 02/19/2014 Physical Exam  Nursing note and vitals reviewed. Constitutional: She appears well-developed and well-nourished. No distress.  HENT:  Head: Normocephalic and atraumatic.  Eyes: Conjunctivae, EOM and lids are normal. Pupils are equal, round, and reactive to  light. Lids are everted and swept, no foreign bodies found. Right eye exhibits no discharge. Left eye exhibits no chemosis and no discharge.  Slit lamp exam:      The right eye shows no foreign body, no fluorescein uptake and no anterior chamber bulge.       The left eye shows corneal abrasion and fluorescein uptake. The left eye shows no corneal ulcer, no foreign body and no anterior chamber bulge.  Very faint diffuse fluorescein uptake left cornea from 9-11oclock position,  No distinct abrasion or stippling.no uptake appreciated right cornea.  Mild conjunctival injection bilaterally.  Complete resolution of pain, but continued photosensitivity after tetracaine instilled.  Cardiovascular: Normal rate.   Pulmonary/Chest: Effort normal and breath sounds normal.  Musculoskeletal: Normal range of motion.  Neurological: She is alert.  Skin: Skin is warm and dry.  Psychiatric: She has a normal mood and affect.    ED Course  Procedures (including critical care time) Labs Review Labs Reviewed - No data to display  Imaging Review No results found.   EKG Interpretation None      MDM   Final diagnoses:  Corneal abrasion due to contact lens, bilateral    Pt with mild corneal irritation/abrasion from contact lens use.  She was given tobrex gtts to protect while the abrasion heals, ketorolac drops for discomfort.  Referral to Dr Iona Hansen for recheck if not better in 2 days.  Avoid wearing contacts.  Visual acuity as noted in nursing notes,  Normal.    [name], PA-C 02/21/14 [place], PA-C 02/21/14 1616

## 2014-02-27 ENCOUNTER — Ambulatory Visit (INDEPENDENT_AMBULATORY_CARE_PROVIDER_SITE_OTHER): Payer: Medicaid Other | Admitting: Women's Health

## 2014-02-27 ENCOUNTER — Encounter: Payer: Self-pay | Admitting: Women's Health

## 2014-02-27 DIAGNOSIS — N719 Inflammatory disease of uterus, unspecified: Secondary | ICD-10-CM | POA: Insufficient documentation

## 2014-02-27 DIAGNOSIS — O99345 Other mental disorders complicating the puerperium: Secondary | ICD-10-CM

## 2014-02-27 DIAGNOSIS — F53 Postpartum depression: Secondary | ICD-10-CM

## 2014-02-27 MED ORDER — ESCITALOPRAM OXALATE 10 MG PO TABS: 10.0000 mg | ORAL_TABLET | Freq: Every day | ORAL | Status: DC

## 2014-02-27 MED ORDER — AMOXICILLIN-POT CLAVULANATE 875-125 MG PO TABS: 1.0000 | ORAL_TABLET | Freq: Two times a day (BID) | ORAL | Status: DC

## 2014-02-27 NOTE — Patient Instructions (Addendum)
NO SEX UNTIL AFTER MIRENA PLACED  Endometritis Endometritis is an irritation, soreness, and swelling (inflammation) of the lining of the uterus (endometrium).  CAUSES   Bacterial infections.  Sexually transmitted infections (STIs).  Having a miscarriage or childbirth, especially after a long labor or cesarean delivery.  Certain gynecological procedures (such as dilation and curettage, hysteroscopy, or contraceptive insertion). SIGNS AND SYMPTOMS   Fever.  Lower abdominal or pelvic pain.  Abnormal vaginal discharge or bleeding.  Abdominal bloating (distention) or swelling.  General discomfort or ill feeling.  Discomfort with bowel movements. DIAGNOSIS  A physical and pelvic exam are performed. Other tests may include:  Cultures from the cervix.  Blood tests.  Examining a tissue sample of the uterine lining (endometrial biopsy).  Examining discharge under a microscope (wet prep).  Laparoscopy. TREATMENT  Antibiotic medicines are usually given. Other treatments may include:  Fluids through an IV tube inserted in your vein.  Rest. HOME CARE INSTRUCTIONS   Take over-the-counter or prescription medicines for pain, discomfort, or fever as directed by your health care provider.  Take your antibiotics as directed. Finish them even if you start to feel better.  Resume your normal diet and activities as directed or as tolerated.  Do not douche or have sexual intercourse until your health care provider says it is okay.  Do not have sexual intercourse until your partner has been treated if your endometritis is caused by an STI. SEEK IMMEDIATE MEDICAL CARE IF:   You have swelling or increasing pain in the abdomen.  You have a fever.  You have bad smelling vaginal discharge, or you have an increased amount of discharge.  You have abnormal vaginal bleeding.  Your medicine is not helping with the pain.  You experience any problems that may be related to the medicine  you are taking.  You have nausea and vomiting, or you cannot keep foods down.  You have pain with bowel movements. MAKE SURE YOU:   Understand these instructions.  Will watch your condition.  Will get help right away if you are not doing well or get worse. Document Released: 06/30/2001 Document Revised: 03/08/2013 Document Reviewed: 02/02/2013 [facility] Patient Information 2015 [place], [place]. This information is not intended to replace advice given to you by your health care provider. Make sure you discuss any questions you have with your health care provider.

## 2014-02-27 NOTE — Progress Notes (Signed)
Patient ID: [name] [name], female   DOB: 08/09/1990, 23 y.o.   MRN: [id number] Subjective:    [name] [name] is a 23 y.o. [phone] Caucasian female who presents for a postpartum visit. She is 7 weeks postpartum following a spontaneous vaginal delivery at 84 gestational weeks after IOL for postdates. Anesthesia: epidural. I have fully reviewed the prenatal and intrapartum course. Postpartum course has been complicated by large clot 5d pp- given methergine x 2d, then endometritis @ 2wks pp- was given augmentin & flagyl. Baby's course has been uncomplicated. Baby is feeding by breast/bottle. Bleeding no bleeding. Bowel function is normal. Bladder function is normal. Patient is not sexually active. Last sexual activity: prior to birth of baby. Contraception method is abstinence and desires mirena. Postpartum depression screening: positive. Score 16.  Not sleeping or eating well, not finding joy in things she used to. Occ thoughts of harming self, none since about 1wk after birth of baby, no plans. Lives w/ parents, having a lot of problems w/ father. Saw [facility] x 2 during pregnancy. Last pap 07/10/13 and was neg. Uterus still 'feels weird'   The following portions of the patient's history were reviewed and updated as appropriate: allergies, current medications, past medical history, past surgical history and problem list.  Review of Systems Pertinent items are noted in HPI.   Filed Vitals:   02/27/14 1409  BP: 102/70  Height: 5\' 1"  (1.549 m)  Weight: 171 lb 8 oz (77.792 kg)   Patient's last menstrual period was 02/19/2014.  Objective:   General:  alert, cooperative and no distress   Breasts:  deferred, no complaints  Lungs: clear to auscultation bilaterally  Heart:  regular rate and rhythm  Abdomen: soft, nontender   Vulva: normal  Vagina: normal vagina  Cervix:  closed  Corpus: Well-involuted, +uterine tenderness  Adnexa:  Non-palpable  Rectal Exam: No hemorrhoids         Assessment:   Postpartum exam 7 wks s/p SVD after IOL for PD Breast/bottle feeding Depression screening PPD Endometritis Contraception counseling   Plan:  Rx augmentin BID x 10d for endometritis, breastfeeding Rx lexapro 10mg  daily w/ 6RF for PPD Contraception: abstinence until Mirena placed Follow up in: 4 weeks for f/u lexapro and mirena placement (waiting for infection to clear, pt ok w/ abstinence this long) or earlier if needed Pt to schedule another appt w/ [facility] To call us if sx not improving after finished augmentin To seek care immediately if develops plan/wants to act on thoughts of harming self  Tawnya Crook CNM, WHNP-BC 02/27/2014 2:20 PM

## 2014-03-05 ENCOUNTER — Telehealth: Payer: Self-pay | Admitting: Women's Health

## 2014-03-05 NOTE — Telephone Encounter (Signed)
Pt states that she was given an antibiotic and lexapro the last time she was in the office. Pt states that they both have the same side effects and she states that she has had a rash on her arms, back, stomach, and legs. Pt has stopped taking both of these medications until she could talk to one of [place]. Pt states that she started taking the medication on the 11th and the rash started Friday and into the weekend. Pt states that the rash has gotten better since stopping the medications.   I advised the pt that I would send this to [facility] and she would hear back from one of [place] by the end of the day.

## 2014-03-05 NOTE — ED Provider Notes (Signed)
Medical screening examination/treatment/procedure(s) were performed by non-physician practitioner and as supervising physician I was immediately available for consultation/collaboration.   EKG Interpretation None        Tanna Furry, MD 03/05/14 [phone]

## 2014-03-06 NOTE — Telephone Encounter (Signed)
Pt returned call. Has taken augmentin before w/o any problems, so continue augmentin until finished, then restart lexapro. If rash develops again w/ either med- call us back.  Roma Schanz, CNM, [facility] 03/06/2014 1:59 PM

## 2014-03-27 ENCOUNTER — Encounter: Payer: Medicaid Other | Admitting: Women's Health

## 2014-05-13 ENCOUNTER — Encounter (HOSPITAL_COMMUNITY): Payer: Self-pay | Admitting: Emergency Medicine

## 2014-05-13 ENCOUNTER — Emergency Department (HOSPITAL_COMMUNITY)
Admission: EM | Admit: 2014-05-13 | Discharge: 2014-05-13 | Disposition: A | Discharge status: Home / Self Care | Payer: Medicaid Other | Attending: Emergency Medicine | Admitting: Emergency Medicine

## 2014-05-13 DIAGNOSIS — Z720 Tobacco use: Secondary | ICD-10-CM | POA: Insufficient documentation

## 2014-05-13 DIAGNOSIS — J45909 Unspecified asthma, uncomplicated: Secondary | ICD-10-CM | POA: Diagnosis not present

## 2014-05-13 DIAGNOSIS — Z8659 Personal history of other mental and behavioral disorders: Secondary | ICD-10-CM | POA: Insufficient documentation

## 2014-05-13 DIAGNOSIS — Z79899 Other long term (current) drug therapy: Secondary | ICD-10-CM | POA: Diagnosis not present

## 2014-05-13 DIAGNOSIS — Z8679 Personal history of other diseases of the circulatory system: Secondary | ICD-10-CM | POA: Diagnosis not present

## 2014-05-13 DIAGNOSIS — R42 Dizziness and giddiness: Secondary | ICD-10-CM | POA: Insufficient documentation

## 2014-05-13 DIAGNOSIS — Z792 Long term (current) use of antibiotics: Secondary | ICD-10-CM | POA: Insufficient documentation

## 2014-05-13 DIAGNOSIS — K649 Unspecified hemorrhoids: Secondary | ICD-10-CM | POA: Diagnosis not present

## 2014-05-13 DIAGNOSIS — K625 Hemorrhage of anus and rectum: Secondary | ICD-10-CM | POA: Diagnosis present

## 2014-05-13 LAB — CBC WITH DIFFERENTIAL/PLATELET
BASOS PCT: 0 % (ref 0–1)
Basophils Absolute: 0 10*3/uL (ref 0.0–0.1)
EOS PCT: 2 % (ref 0–5)
Eosinophils Absolute: 0.2 10*3/uL (ref 0.0–0.7)
HCT: 36.1 % (ref 36.0–46.0)
Hemoglobin: 11.7 g/dL — ABNORMAL LOW (ref 12.0–15.0)
LYMPHS PCT: 29 % (ref 12–46)
Lymphs Abs: 2.8 10*3/uL (ref 0.7–4.0)
MCH: 24.8 pg — ABNORMAL LOW (ref 26.0–34.0)
MCHC: 32.4 g/dL (ref 30.0–36.0)
MCV: 76.5 fL — ABNORMAL LOW (ref 78.0–100.0)
MONO ABS: 0.5 10*3/uL (ref 0.1–1.0)
Monocytes Relative: 5 % (ref 3–12)
NEUTROS ABS: 6.4 10*3/uL (ref 1.7–7.7)
Neutrophils Relative %: 64 % (ref 43–77)
PLATELETS: 327 10*3/uL (ref 150–400)
RBC: 4.72 MIL/uL (ref 3.87–5.11)
RDW: 14.6 % (ref 11.5–15.5)
WBC: 9.9 10*3/uL (ref 4.0–10.5)

## 2014-05-13 MED ORDER — PRAMOXINE HCL 1 % RE FOAM: 1.0000 "application " | Freq: Three times a day (TID) | RECTAL | Status: DC | PRN

## 2014-05-13 MED ORDER — DOCUSATE SODIUM 100 MG PO CAPS: 100.0000 mg | ORAL_CAPSULE | Freq: Two times a day (BID) | ORAL | Status: DC

## 2014-05-13 NOTE — Discharge Instructions (Signed)

## 2014-05-13 NOTE — ED Notes (Signed)
Per MD hemorrhoids noted around rectum.  No bleeding noted at this time.  Pt. Too tender for internal rectal exam.

## 2014-05-13 NOTE — ED Notes (Signed)
Pt states she has bloody stools for a week, bright red in color, pt with hx of hemorrhoids

## 2014-05-13 NOTE — ED Provider Notes (Signed)
CSN: [id number]     Arrival date & time 05/13/14  1042 History   This chart was scribed for [name] Rank, MD by Hilda Lias, ED Scribe. This patient was seen in room APA07/APA07 and the patient's care was started at 11:14 AM.     Chief Complaint  Patient presents with  . Rectal Bleeding      HPI  HPI Comments: [name] [name] is a 23 y.o. female who presents to the Emergency Department complaining of rectal bleeding that has been intermittent for a week. Pt has history of hemorrhoids and has been to ED for similar symptoms before but has not been to a Psychologist, sport and exercise. Pt notes feeling light-headed occasionally whenever episodes of rectal bleeding occur. Pt denies fever and vomiting.    Past Medical History  Diagnosis Date  . Asthma   . ADHD   . Migraines     with aura (see flashes)  . Pregnant    Past Surgical History  Procedure Laterality Date  . Dilation and curettage of uterus    . Wisdom tooth extraction    . Jaw wired shut    . 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 Diabetes Mother   . Liver disease Mother     fatty liver   . Hypertension Father   . COPD Father   . Hypertension Brother   . COPD Brother   . Heart disease Maternal Grandmother   . Diabetes Maternal Grandmother    History  Substance Use Topics  . Smoking status: Current Some Day Smoker -- 1.00 packs/day    Types: Cigarettes    Last Attempt to Quit: 01/08/2012  . Smokeless tobacco: Never Used  . Alcohol Use: Yes     Comment: every now and then   OB History   Grav Para Term Preterm Abortions TAB SAB Ect Mult Living   4 3 3  1  1   3      Review of Systems  Constitutional: Negative for fever.  Gastrointestinal: Positive for anal bleeding. Negative for nausea and vomiting.  Neurological: Positive for light-headedness.     A complete 10 system review of systems was obtained and all systems are negative except as noted in the HPI and PMH.  Allergies   Darvocet; Imitrex; Adhesive; Codeine; Percocet; and Sulfa antibiotics  Home Medications   Prior to Admission medications   Medication Sig Start Date End Date Taking? Authorizing Provider  albuterol (PROVENTIL HFA;VENTOLIN HFA) 108 (90 BASE) MCG/ACT inhaler Inhale into the lungs every 6 (six) hours as needed for wheezing or shortness of breath.    Historical Provider, MD  amoxicillin-clavulanate (AUGMENTIN) 875-125 MG per tablet Take 1 tablet by mouth 2 (two) times daily. X 10 days 02/27/14   Tawnya Crook, CNM  escitalopram (LEXAPRO) 10 MG tablet Take 1 tablet (10 mg total) by mouth daily. 02/27/14   Tawnya Crook, CNM  Pediatric Multiple Vit-C-FA (FLINSTONES GUMMIES OMEGA-3 DHA PO) Take 2 tablets by mouth daily.     Historical Provider, MD   BP 108/49  Pulse 73  Temp(Src) 98.1 F (36.7 C) (Oral)  Resp 18  Ht 5' (1.524 m)  Wt 165 lb (74.844 kg)  BMI 32.22 kg/m2  SpO2 100%  LMP 04/06/2014 Physical Exam  Nursing note and vitals reviewed. Constitutional: She appears well-developed and well-nourished. No distress.  HENT:  Head: Normocephalic and atraumatic.  Right Ear: External ear normal.  Left Ear: External  ear normal.  Eyes: Conjunctivae are normal. Right eye exhibits no discharge. Left eye exhibits no discharge. No scleral icterus.  Neck: Neck supple. No tracheal deviation present.  Cardiovascular: Normal rate, regular rhythm and intact distal pulses.   Pulmonary/Chest: Effort normal and breath sounds normal. No stridor. No respiratory distress. She has no wheezes. She has no rales.  Abdominal: Soft. Bowel sounds are normal. She exhibits no distension. There is no tenderness. There is no rebound and no guarding.  Genitourinary: Rectal exam shows external hemorrhoid, internal hemorrhoid and tenderness. Rectal exam shows no mass and anal tone normal.  No blood on exam  Musculoskeletal: She exhibits no edema and no tenderness.  Neurological: She is alert. She has  normal strength. No cranial nerve deficit (no facial droop, extraocular movements intact, no slurred speech) or sensory deficit. She exhibits normal muscle tone. She displays no seizure activity. Coordination normal.  Skin: Skin is warm and dry. No rash noted.  Psychiatric: She has a normal mood and affect.    ED Course  Procedures (including critical care time)  DIAGNOSTIC STUDIES: Oxygen Saturation is 100% on RA, normal by my interpretation.    COORDINATION OF CARE: 11:17 AM Discussed treatment plan with pt at bedside and pt agreed to plan.   Labs Review Labs Reviewed  CBC WITH DIFFERENTIAL - Abnormal; Notable for the following:    Hemoglobin 11.7 (*)    MCV 76.5 (*)    MCH 24.8 (*)    All other components within normal limits    Hgb is stable  MDM   Final diagnoses:  Hemorrhoids, unspecified hemorrhoid type    DC home with proctofoam.  Consider surgical follow up due to recurrent nature  I personally performed the services described in this documentation, which was scribed in my presence.  The recorded information has been reviewed and is accurate.    [name] Rank, MD 05/13/14 [phone]

## 2014-05-21 ENCOUNTER — Encounter (HOSPITAL_COMMUNITY): Payer: Self-pay | Admitting: Emergency Medicine

## 2014-10-10 ENCOUNTER — Encounter (INDEPENDENT_AMBULATORY_CARE_PROVIDER_SITE_OTHER): Payer: Self-pay | Admitting: *Deleted

## 2014-10-31 ENCOUNTER — Ambulatory Visit (INDEPENDENT_AMBULATORY_CARE_PROVIDER_SITE_OTHER): Payer: Medicaid Other | Admitting: Internal Medicine

## 2014-10-31 ENCOUNTER — Encounter (INDEPENDENT_AMBULATORY_CARE_PROVIDER_SITE_OTHER): Payer: Self-pay | Admitting: Internal Medicine

## 2014-10-31 VITALS — BP 100/62 | HR 76 | Temp 98.0°F | Ht 61.0 in | Wt 167.9 lb

## 2014-10-31 DIAGNOSIS — R1030 Lower abdominal pain, unspecified: Secondary | ICD-10-CM

## 2014-10-31 DIAGNOSIS — R195 Other fecal abnormalities: Secondary | ICD-10-CM | POA: Diagnosis not present

## 2014-10-31 MED ORDER — DICYCLOMINE HCL 10 MG PO CAPS: 10.0000 mg | ORAL_CAPSULE | Freq: Three times a day (TID) | ORAL | Status: DC

## 2014-10-31 NOTE — Patient Instructions (Addendum)
CBC, CMET, GI pathogen, CT abdomen/pelvis with CM OV in 3 months Rx for Dicyclomine 10mg  TID

## 2014-10-31 NOTE — Progress Notes (Addendum)
Subjective:    Patient ID: [name] [name], female    DOB: 1990/11/04, 24 y.o.   MRN: [id number]  [facility]" . She actually does not have constipation but loose stools.  She has not been on any recent antibiotics.  She c/o lower abdominal pain. She says the pain is worse than menstrual cramps. Pain will last anywhere 5-20 minutes and occurs several times during the day.   She says today she has rectal pain.  Stools are usually yellowish brown.  She has had the pain for over a year. The pain is related to her BMs. She usually has a BM 1-2 a day. Most of her stools are very loose or soft.  She says her stools look like mucous and sometimes it has blood.  Her appetite is not good. She says she does not have an appetite, though she has not lost any weight. She does tell me she has a hx of eating paper. There is no acid reflux. No family hx of IBD or Colon cancer.  Review of Systems   Married. 3 children in good health. (vaginal deliveries) Age 48, 3, and 10 months. No birthcontrol. LMP was either February or March. Hx of Mole Pregnancy   Past Medical History  Diagnosis Date  . Asthma   . ADHD   . Migraines     with aura (see flashes)  . Pregnant     Past Surgical History  Procedure Laterality Date  . Dilation and curettage of uterus    . Wisdom tooth extraction    . Jaw wired shut    . 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 Darvocet [Propoxyphene N-Acetaminophen] Other (See Comments)    Hallucinations - Dislocated her jaw during a vivid episode  . Imitrex [Sumatriptan Base]     Patient states that imitrex gives her chest pain  . Adhesive [Tape] Hives  . Codeine Other (See Comments)    unknown  . Percocet [Oxycodone-Acetaminophen] Other (See Comments)    "made me feel funny"   . Sulfa Antibiotics Swelling, Rash and Other (See Comments)    Eye drops at child -- caused pain, swelling, &  redness.  Her doctor said it was an allergy.    Does not remember name of drug but had, "face" sound in its name.    Current Outpatient Prescriptions on File Prior to Visit  Medication Sig Dispense Refill  . docusate sodium (COLACE) 100 MG capsule Take 1 capsule (100 mg total) by mouth every 12 (twelve) hours. 60 capsule 0  . pramoxine (PROCTOFOAM) 1 % foam Place 1 application rectally 3 (three) times daily as needed for itching. 15 g 0   No current facility-administered medications on file prior to visit.        Objective:   Physical Exam Blood pressure 100/62, pulse 76, temperature 98 F (36.7 C), height 5\' 1"  (1.549 m), weight 167 lb 14.4 oz (76.159 kg), currently breastfeeding.  Alert and oriented. Skin warm and dry. Oral mucosa is moist.   . Sclera anicteric, conjunctivae is pink. Thyroid not enlarged. No cervical lymphadenopathy. Lungs clear. Heart regular rate and rhythm.  Abdomen is soft. Bowel sounds are positive. No hepatomegaly. No abdominal masses felt. No tenderness while in office.  No edema to lower extremities.  Stool very little and guaiac positive.   Hemocult  Lot [id number]. Exp. Date 2016-05    Assessment &  Plan:  Abdominal pain. Blood in stool CBC, CMET, stool studies. CT abdomen/pelvis with CM.  Dicyclomine 10mg  TID, Further recommenations once we have labs and CT back. OV in 3 months

## 2014-11-01 LAB — CBC WITH DIFFERENTIAL/PLATELET
BASOS ABS: 0 10*3/uL (ref 0.0–0.1)
Basophils Relative: 0 % (ref 0–1)
Eosinophils Absolute: 0.3 10*3/uL (ref 0.0–0.7)
Eosinophils Relative: 2 % (ref 0–5)
HEMATOCRIT: 38.2 % (ref 36.0–46.0)
HEMOGLOBIN: 12.3 g/dL (ref 12.0–15.0)
Lymphocytes Relative: 25 % (ref 12–46)
Lymphs Abs: 3.8 10*3/uL (ref 0.7–4.0)
MCH: 24.4 pg — ABNORMAL LOW (ref 26.0–34.0)
MCHC: 32.2 g/dL (ref 30.0–36.0)
MCV: 75.8 fL — ABNORMAL LOW (ref 78.0–100.0)
MONOS PCT: 5 % (ref 3–12)
MPV: 10.9 fL (ref 8.6–12.4)
Monocytes Absolute: 0.8 10*3/uL (ref 0.1–1.0)
NEUTROS ABS: 10.3 10*3/uL — AB (ref 1.7–7.7)
NEUTROS PCT: 68 % (ref 43–77)
Platelets: 398 10*3/uL (ref 150–400)
RBC: 5.04 MIL/uL (ref 3.87–5.11)
RDW: 17.1 % — AB (ref 11.5–15.5)
WBC: 15.2 10*3/uL — AB (ref 4.0–10.5)

## 2014-11-01 LAB — COMPREHENSIVE METABOLIC PANEL
ALT: 11 U/L (ref 0–35)
AST: 11 U/L (ref 0–37)
Albumin: 4.1 g/dL (ref 3.5–5.2)
Alkaline Phosphatase: 52 U/L (ref 39–117)
BILIRUBIN TOTAL: 0.5 mg/dL (ref 0.2–1.2)
BUN: 9 mg/dL (ref 6–23)
CO2: 28 mEq/L (ref 19–32)
CREATININE: 0.69 mg/dL (ref 0.50–1.10)
Calcium: 9.9 mg/dL (ref 8.4–10.5)
Chloride: 104 mEq/L (ref 96–112)
GLUCOSE: 69 mg/dL — AB (ref 70–99)
Potassium: 4.2 mEq/L (ref 3.5–5.3)
Sodium: 138 mEq/L (ref 135–145)
Total Protein: 6.6 g/dL (ref 6.0–8.3)

## 2014-11-06 ENCOUNTER — Telehealth (INDEPENDENT_AMBULATORY_CARE_PROVIDER_SITE_OTHER): Payer: Self-pay | Admitting: Internal Medicine

## 2014-11-06 ENCOUNTER — Ambulatory Visit (HOSPITAL_COMMUNITY)
Admission: RE | Admit: 2014-11-06 | Discharge: 2014-11-06 | Disposition: A | Discharge status: Home / Self Care | Payer: Medicaid Other | Source: Ambulatory Visit | Attending: Internal Medicine | Admitting: Internal Medicine

## 2014-11-06 ENCOUNTER — Encounter (HOSPITAL_COMMUNITY): Payer: Self-pay

## 2014-11-06 DIAGNOSIS — R1030 Lower abdominal pain, unspecified: Secondary | ICD-10-CM | POA: Diagnosis not present

## 2014-11-06 DIAGNOSIS — R110 Nausea: Secondary | ICD-10-CM | POA: Diagnosis not present

## 2014-11-06 DIAGNOSIS — D500 Iron deficiency anemia secondary to blood loss (chronic): Secondary | ICD-10-CM

## 2014-11-06 DIAGNOSIS — R195 Other fecal abnormalities: Secondary | ICD-10-CM | POA: Insufficient documentation

## 2014-11-06 MED ORDER — IOHEXOL 300 MG/ML  SOLN
100.0000 mL | Freq: Once | INTRAMUSCULAR | Status: AC | PRN
  Administered 2014-11-06: 100 mL via INTRAVENOUS

## 2014-11-06 NOTE — Telephone Encounter (Signed)
Ordered Iron studies and I gave this to Butch Penny to fax upstairs to [place].

## 2014-11-06 NOTE — Telephone Encounter (Signed)
error 

## 2014-11-08 LAB — IRON AND TIBC
%SAT: 5 % — ABNORMAL LOW (ref 20–55)
IRON: 20 ug/dL — AB (ref 42–145)
TIBC: 410 ug/dL (ref 250–470)
UIBC: 390 ug/dL (ref 125–400)

## 2014-11-08 LAB — FERRITIN: Ferritin: 4 ng/mL — ABNORMAL LOW (ref 10–291)

## 2014-11-12 ENCOUNTER — Telehealth (INDEPENDENT_AMBULATORY_CARE_PROVIDER_SITE_OTHER): Payer: Self-pay | Admitting: Internal Medicine

## 2014-11-12 DIAGNOSIS — R197 Diarrhea, unspecified: Secondary | ICD-10-CM

## 2014-11-12 NOTE — Telephone Encounter (Signed)
Stool studies.  

## 2014-12-14 ENCOUNTER — Telehealth (INDEPENDENT_AMBULATORY_CARE_PROVIDER_SITE_OTHER): Payer: Self-pay | Admitting: Internal Medicine

## 2014-12-14 NOTE — Telephone Encounter (Signed)
Patient needs a colonoscopy to rule out Inflammatory bowel disease. Continues to have mucous and some rectal bleeding. She is anemic. Will not start on iron till after colonoscopy. I have spoken with patient. Will make arrangements next week.    [name] [name], Patient needs colonoscopy with Propofol

## 2014-12-18 ENCOUNTER — Encounter (INDEPENDENT_AMBULATORY_CARE_PROVIDER_SITE_OTHER): Payer: Self-pay | Admitting: *Deleted

## 2014-12-18 ENCOUNTER — Telehealth (INDEPENDENT_AMBULATORY_CARE_PROVIDER_SITE_OTHER): Payer: Self-pay | Admitting: *Deleted

## 2014-12-18 ENCOUNTER — Other Ambulatory Visit (INDEPENDENT_AMBULATORY_CARE_PROVIDER_SITE_OTHER): Payer: Self-pay | Admitting: *Deleted

## 2014-12-18 DIAGNOSIS — Z1211 Encounter for screening for malignant neoplasm of colon: Secondary | ICD-10-CM

## 2014-12-18 DIAGNOSIS — D649 Anemia, unspecified: Secondary | ICD-10-CM

## 2014-12-18 MED ORDER — PEG 3350-KCL-NA BICARB-NACL 420 G PO SOLR: 4000.0000 mL | Freq: Once | ORAL | Status: DC

## 2014-12-18 NOTE — Telephone Encounter (Signed)
TCS w/ propofol sch'd 12/24/14 at 845, preop 6/3 @ 8, patient aware and will come by office to get instruction sheet

## 2014-12-18 NOTE — Telephone Encounter (Signed)
Patient needs trilyte 

## 2014-12-20 NOTE — Patient Instructions (Signed)
[name] [name]  12/20/2014     Your procedure is scheduled on 12/24/14.  Report to [facility] at 07:15 A.M.  Call this number if you have problems the morning of surgery:  [phone]   Remember:  Do not eat food or drink liquids after midnight.  Take these medicines the morning of surgery with A SIP OF WATER None   Do not wear jewelry, make-up or nail polish.  Do not wear lotions, powders, or perfumes.  You may wear deodorant.  Do not bring valuables to the hospital.  [facility] is not responsible for any belongings or valuables.  Contacts, dentures or bridgework may not be worn into surgery.  Leave your suitcase in the car.  After surgery it may be brought to your room.  For patients admitted to the hospital, discharge time will be determined by your treatment team.  Patients discharged the day of surgery will not be allowed to drive home.   Special instructions:  Start using your bowel prep as directed by your Gastrointestinal doctor.  Please read over the following fact sheets that you were given. Anesthesia Post-op Instructions and Care and Recovery After Surgery    Colonoscopy A colonoscopy is an exam to look at the entire large intestine (colon). This exam can help find problems such as tumors, polyps, inflammation, and areas of bleeding. The exam takes about 1 hour.  LET [facility] CARE PROVIDER KNOW ABOUT:   Any allergies you have.  All medicines you are taking, including vitamins, herbs, eye drops, creams, and over-the-counter medicines.  Previous problems you or members of your family have had with the use of anesthetics.  Any blood disorders you have.  Previous surgeries you have had.  Medical conditions you have. RISKS AND COMPLICATIONS  Generally, this is a safe procedure. However, as with any procedure, complications can occur. Possible complications include:  Bleeding.  Tearing or rupture of the colon wall.  Reaction to medicines given during  the exam.  Infection (rare). BEFORE THE PROCEDURE   Ask your health care provider about changing or stopping your regular medicines.  You may be prescribed an oral bowel prep. This involves drinking a large amount of medicated liquid, starting the day before your procedure. The liquid will cause you to have multiple loose stools until your stool is almost clear or light green. This cleans out your colon in preparation for the procedure.  Do not eat or drink anything else once you have started the bowel prep, unless your health care provider tells you it is safe to do so.  Arrange for someone to drive you home after the procedure. PROCEDURE   You will be given medicine to help you relax (sedative).  You will lie on your side with your knees bent.  A long, flexible tube with a light and camera on the end (colonoscope) will be inserted through the rectum and into the colon. The camera sends video back to a computer screen as it moves through the colon. The colonoscope also releases carbon dioxide gas to inflate the colon. This helps your health care provider see the area better.  During the exam, your health care provider may take a small tissue sample (biopsy) to be examined under a microscope if any abnormalities are found.  The exam is finished when the entire colon has been viewed. AFTER THE PROCEDURE   Do not drive for 24 hours after the exam.  You may have a small amount of blood  in your stool.  You may pass moderate amounts of gas and have mild abdominal cramping or bloating. This is caused by the gas used to inflate your colon during the exam.  Ask when your test results will be ready and how you will get your results. Make sure you get your test results. Document Released: 07/03/2000 Document Revised: 04/26/2013 Document Reviewed: 03/13/2013 [facility] Patient Information 2015 [place], [place]. This information is not intended to replace advice given to you by your health care  provider. Make sure you discuss any questions you have with your health care provider.    Monitored Anesthesia Care Monitored anesthesia care is an anesthesia service for a medical procedure. Anesthesia is the loss of the ability to feel pain. It is produced by medicines called anesthetics. It may affect a small area of your body (local anesthesia), a large area of your body (regional anesthesia), or your entire body (general anesthesia). The need for monitored anesthesia care depends your procedure, your condition, and the potential need for regional or general anesthesia. It is often provided during procedures where:   General anesthesia may be needed if there are complications. This is because you need special care when you are under general anesthesia.   You will be under local or regional anesthesia. This is so that you are able to have higher levels of anesthesia if needed.   You will receive calming medicines (sedatives). This is especially the case if sedatives are given to put you in a semi-conscious state of relaxation (deep sedation). This is because the amount of sedative needed to produce this state can be hard to predict. Too much of a sedative can produce general anesthesia. Monitored anesthesia care is performed by one or more health care providers who have special training in all types of anesthesia. You will need to meet with these health care providers before your procedure. During this meeting, they will ask you about your medical history. They will also give you instructions to follow. (For example, you will need to stop eating and drinking before your procedure. You may also need to stop or change medicines you are taking.) During your procedure, your health care providers will stay with you. They will:   Watch your condition. This includes watching your blood pressure, breathing, and level of pain.   Diagnose and treat problems that occur.   Give medicines if they are  needed. These may include calming medicines (sedatives) and anesthetics.   Make sure you are comfortable.  Having monitored anesthesia care does not necessarily mean that you will be under anesthesia. It does mean that your health care providers will be able to manage anesthesia if you need it or if it occurs. It also means that you will be able to have a different type of anesthesia than you are having if you need it. When your procedure is complete, your health care providers will continue to watch your condition. They will make sure any medicines wear off before you are allowed to go home.  Document Released: 04/01/2005 Document Revised: 11/20/2013 Document Reviewed: 08/17/2012 [facility] Patient Information 2015 [place], [place]. This information is not intended to replace advice given to you by your health care provider. Make sure you discuss any questions you have with your health care provider.

## 2014-12-21 ENCOUNTER — Encounter (HOSPITAL_COMMUNITY): Payer: Self-pay

## 2014-12-21 ENCOUNTER — Encounter (HOSPITAL_COMMUNITY)
Admission: RE | Admit: 2014-12-21 | Discharge: 2014-12-21 | Disposition: A | Discharge status: Home / Self Care | Payer: Medicaid Other | Source: Ambulatory Visit | Attending: Internal Medicine | Admitting: Internal Medicine

## 2014-12-21 DIAGNOSIS — K625 Hemorrhage of anus and rectum: Secondary | ICD-10-CM | POA: Diagnosis not present

## 2014-12-21 DIAGNOSIS — Z01818 Encounter for other preprocedural examination: Secondary | ICD-10-CM | POA: Diagnosis present

## 2014-12-21 LAB — BASIC METABOLIC PANEL
ANION GAP: 10 (ref 5–15)
BUN: 12 mg/dL (ref 6–20)
CALCIUM: 9.5 mg/dL (ref 8.9–10.3)
CHLORIDE: 105 mmol/L (ref 101–111)
CO2: 25 mmol/L (ref 22–32)
CREATININE: 0.87 mg/dL (ref 0.44–1.00)
GFR calc Af Amer: >60 mL/min (ref >60)
GFR calc non Af Amer: >60 mL/min (ref >60)
Glucose, Bld: 68 mg/dL (ref 65–99)
Potassium: 3.8 mmol/L (ref 3.5–5.1)
SODIUM: 140 mmol/L (ref 135–145)

## 2014-12-21 LAB — HCG, SERUM, QUALITATIVE: Preg, Serum: NEGATIVE

## 2014-12-21 LAB — CBC
HCT: 40.3 % (ref 36.0–46.0)
HEMOGLOBIN: 13.1 g/dL (ref 12.0–15.0)
MCH: 26.4 pg (ref 26.0–34.0)
MCHC: 32.5 g/dL (ref 30.0–36.0)
MCV: 81.1 fL (ref 78.0–100.0)
Platelets: 306 10*3/uL (ref 150–400)
RBC: 4.97 MIL/uL (ref 3.87–5.11)
RDW: 15.8 % — ABNORMAL HIGH (ref 11.5–15.5)
WBC: 9.8 10*3/uL (ref 4.0–10.5)

## 2014-12-24 ENCOUNTER — Encounter (HOSPITAL_COMMUNITY)
Admission: RE | Disposition: A | Discharge status: Home / Self Care | Payer: Self-pay | Source: Ambulatory Visit | Attending: Internal Medicine

## 2014-12-24 ENCOUNTER — Ambulatory Visit (HOSPITAL_COMMUNITY)
Admission: RE | Admit: 2014-12-24 | Discharge: 2014-12-24 | Disposition: A | Discharge status: Home / Self Care | Payer: Medicaid Other | Source: Ambulatory Visit | Attending: Internal Medicine | Admitting: Internal Medicine

## 2014-12-24 ENCOUNTER — Ambulatory Visit (HOSPITAL_COMMUNITY): Payer: Medicaid Other | Admitting: Anesthesiology

## 2014-12-24 ENCOUNTER — Encounter (HOSPITAL_COMMUNITY): Payer: Self-pay

## 2014-12-24 DIAGNOSIS — K625 Hemorrhage of anus and rectum: Secondary | ICD-10-CM | POA: Diagnosis present

## 2014-12-24 DIAGNOSIS — D649 Anemia, unspecified: Secondary | ICD-10-CM

## 2014-12-24 DIAGNOSIS — D124 Benign neoplasm of descending colon: Secondary | ICD-10-CM | POA: Diagnosis not present

## 2014-12-24 DIAGNOSIS — D122 Benign neoplasm of ascending colon: Secondary | ICD-10-CM | POA: Insufficient documentation

## 2014-12-24 DIAGNOSIS — R1030 Lower abdominal pain, unspecified: Secondary | ICD-10-CM

## 2014-12-24 DIAGNOSIS — Z79899 Other long term (current) drug therapy: Secondary | ICD-10-CM | POA: Insufficient documentation

## 2014-12-24 DIAGNOSIS — F17210 Nicotine dependence, cigarettes, uncomplicated: Secondary | ICD-10-CM | POA: Diagnosis not present

## 2014-12-24 DIAGNOSIS — F909 Attention-deficit hyperactivity disorder, unspecified type: Secondary | ICD-10-CM | POA: Diagnosis not present

## 2014-12-24 DIAGNOSIS — K5900 Constipation, unspecified: Secondary | ICD-10-CM | POA: Diagnosis present

## 2014-12-24 DIAGNOSIS — K648 Other hemorrhoids: Secondary | ICD-10-CM | POA: Insufficient documentation

## 2014-12-24 DIAGNOSIS — K644 Residual hemorrhoidal skin tags: Secondary | ICD-10-CM | POA: Insufficient documentation

## 2014-12-24 DIAGNOSIS — J45909 Unspecified asthma, uncomplicated: Secondary | ICD-10-CM | POA: Diagnosis not present

## 2014-12-24 DIAGNOSIS — R1032 Left lower quadrant pain: Secondary | ICD-10-CM | POA: Diagnosis present

## 2014-12-24 DIAGNOSIS — G43109 Migraine with aura, not intractable, without status migrainosus: Secondary | ICD-10-CM | POA: Diagnosis not present

## 2014-12-24 DIAGNOSIS — R194 Change in bowel habit: Secondary | ICD-10-CM | POA: Diagnosis not present

## 2014-12-24 DIAGNOSIS — K922 Gastrointestinal hemorrhage, unspecified: Secondary | ICD-10-CM | POA: Diagnosis not present

## 2014-12-24 HISTORY — PX: POLYPECTOMY: SHX5525

## 2014-12-24 HISTORY — PX: COLONOSCOPY WITH PROPOFOL: SHX5780

## 2014-12-24 SURGERY — COLONOSCOPY WITH PROPOFOL
Anesthesia: Monitor Anesthesia Care | Site: Anus

## 2014-12-24 MED ORDER — PROPOFOL INFUSION 10 MG/ML OPTIME
INTRAVENOUS | Status: DC | PRN
  Administered 2014-12-24: 100 ug/kg/min via INTRAVENOUS

## 2014-12-24 MED ORDER — MIDAZOLAM HCL 2 MG/2ML IJ SOLN
INTRAMUSCULAR | Status: AC
  Filled 2014-12-24: qty 2

## 2014-12-24 MED ORDER — FENTANYL CITRATE (PF) 100 MCG/2ML IJ SOLN
INTRAMUSCULAR | Status: AC
  Filled 2014-12-24: qty 2

## 2014-12-24 MED ORDER — ONDANSETRON HCL 4 MG/2ML IJ SOLN: 4.0000 mg | Freq: Once | INTRAMUSCULAR | Status: DC | PRN

## 2014-12-24 MED ORDER — ONDANSETRON HCL 4 MG/2ML IJ SOLN
INTRAMUSCULAR | Status: AC
  Filled 2014-12-24: qty 2

## 2014-12-24 MED ORDER — STERILE WATER FOR IRRIGATION IR SOLN
Status: DC | PRN
  Administered 2014-12-24: 1000 mL

## 2014-12-24 MED ORDER — LIDOCAINE HCL (CARDIAC) 10 MG/ML IV SOLN
INTRAVENOUS | Status: DC | PRN
  Administered 2014-12-24: 25 mg via INTRAVENOUS

## 2014-12-24 MED ORDER — ONDANSETRON HCL 4 MG/2ML IJ SOLN
4.0000 mg | Freq: Once | INTRAMUSCULAR | Status: AC
  Administered 2014-12-24: 4 mg via INTRAVENOUS

## 2014-12-24 MED ORDER — LACTATED RINGERS IV SOLN
INTRAVENOUS | Status: DC | PRN
  Administered 2014-12-24: 1000 mL

## 2014-12-24 MED ORDER — FENTANYL CITRATE (PF) 100 MCG/2ML IJ SOLN: 25.0000 ug | INTRAMUSCULAR | Status: DC | PRN

## 2014-12-24 MED ORDER — PROPOFOL 10 MG/ML IV BOLUS
INTRAVENOUS | Status: AC
  Filled 2014-12-24: qty 20

## 2014-12-24 MED ORDER — MIDAZOLAM HCL 2 MG/2ML IJ SOLN
1.0000 mg | INTRAMUSCULAR | Status: DC | PRN
Start: 2014-12-24 — End: 2014-12-24
  Administered 2014-12-24: 2 mg via INTRAVENOUS

## 2014-12-24 MED ORDER — MIDAZOLAM HCL 5 MG/5ML IJ SOLN
INTRAMUSCULAR | Status: DC | PRN
  Administered 2014-12-24 (×2): 1 mg via INTRAVENOUS

## 2014-12-24 MED ORDER — HYDROCORTISONE ACETATE 25 MG RE SUPP: 25.0000 mg | Freq: Every day | RECTAL | Status: DC

## 2014-12-24 MED ORDER — BENEFIBER DRINK MIX PO PACK: 4.0000 g | PACK | Freq: Every day | ORAL | Status: DC

## 2014-12-24 MED ORDER — DICYCLOMINE HCL 10 MG PO CAPS: 10.0000 mg | ORAL_CAPSULE | Freq: Three times a day (TID) | ORAL | Status: DC

## 2014-12-24 MED ORDER — FENTANYL CITRATE (PF) 100 MCG/2ML IJ SOLN
25.0000 ug | INTRAMUSCULAR | Status: AC
  Administered 2014-12-24 (×2): 25 ug via INTRAVENOUS

## 2014-12-24 MED ORDER — FENTANYL CITRATE (PF) 100 MCG/2ML IJ SOLN
INTRAMUSCULAR | Status: DC | PRN
  Administered 2014-12-24 (×2): 50 ug via INTRAVENOUS

## 2014-12-24 MED ORDER — LACTATED RINGERS IV SOLN
INTRAVENOUS | Status: DC
  Administered 2014-12-24: 09:00:00 via INTRAVENOUS

## 2014-12-24 SURGICAL SUPPLY — 22 items
ELECT REM PT RETURN 9FT ADLT (ELECTROSURGICAL)
ELECTRODE REM PT RTRN 9FT ADLT (ELECTROSURGICAL) IMPLANT
FCP BXJMBJMB 240X2.8X (CUTTING FORCEPS)
FLOOR PAD 36X40 (MISCELLANEOUS) ×4
FORCEPS BIOP RAD 4 LRG CAP 4 (CUTTING FORCEPS) ×4 IMPLANT
FORCEPS BIOP RJ4 240 W/NDL (CUTTING FORCEPS)
FORCEPS BXJMBJMB 240X2.8X (CUTTING FORCEPS) IMPLANT
FORMALIN 10 PREFIL 20ML (MISCELLANEOUS) ×4 IMPLANT
INJECTOR/SNARE I SNARE (MISCELLANEOUS) IMPLANT
KIT ENDO PROCEDURE PEN (KITS) ×4 IMPLANT
MANIFOLD NEPTUNE II (INSTRUMENTS) ×4 IMPLANT
NEEDLE SCLEROTHERAPY 25GX240 (NEEDLE) IMPLANT
PAD FLOOR 36X40 (MISCELLANEOUS) ×2 IMPLANT
PROBE APC STR FIRE (PROBE) IMPLANT
PROBE INJECTION GOLD (MISCELLANEOUS)
PROBE INJECTION GOLD 7FR (MISCELLANEOUS) IMPLANT
ROTH PLATINUM NET UNIVERSAL (MISCELLANEOUS) ×4 IMPLANT
SNARE ROTATE MED OVAL 20MM (MISCELLANEOUS) IMPLANT
SNARE SHORT THROW 13M SML OVAL (MISCELLANEOUS) ×4 IMPLANT
TRAP SPECIMEN MUCOUS 40CC (MISCELLANEOUS) IMPLANT
TUBING IRRIGATION ENDOGATOR (MISCELLANEOUS) ×4 IMPLANT
WATER STERILE IRR 1000ML POUR (IV SOLUTION) ×4 IMPLANT

## 2014-12-24 NOTE — H&P (Signed)
[name] [name] is an 24 y.o. female.   Chief Complaint:  Patient is here for colonoscopy. HPI: Patient is 24 year old Caucasian female who presents with several month history of intermittent rectal bleeding and constipation. 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 Complains of pain in left low quadrant of her abdomen. Lately she's been passing mucus and blood. She hasn't had a normal bowel movement in several months. She states she has lost 6 or 7 pounds in the last few weeks. She does not take OTC NSAIDs. Maternal aunt was recently diagnosed with colitis but details not available.  Past Medical History  Diagnosis Date  . Asthma   . ADHD   . Migraines     with aura (see flashes)    Past Surgical History  Procedure Laterality Date  . Dilation and curettage of uterus    . Wisdom tooth extraction    . Jaw wired shut    . Penny removed from esophagus      Family History  Problem Relation Age of Onset  . Diabetes Mother   . Liver disease Mother     fatty liver   . Hypertension Father   . COPD Father   . Hypertension Brother   . COPD Brother   . Heart disease Maternal Grandmother   . Diabetes Maternal Grandmother    Social History:  reports that she has been smoking Cigarettes.  She has been smoking about 1.00 pack per day. She has never used smokeless tobacco. She reports that she drinks alcohol. She reports that she does not use illicit drugs.  Allergies:  Allergies  Allergen Reactions  . Darvocet [Propoxyphene N-Acetaminophen] Other (See Comments)    Hallucinations - Dislocated her jaw during a vivid episode  . Imitrex [Sumatriptan Base]     Patient states that imitrex gives her chest pain  . Adhesive [Tape] Hives  . Codeine Other (See Comments)    unknown  . Percocet [Oxycodone-Acetaminophen] Other (See Comments)    "made me feel funny"   . Sulfa Antibiotics Swelling, Rash and Other (See Comments)    Eye drops at child -- caused pain, swelling, & redness.   Her doctor said it was an allergy.    Does not remember name of drug but had, "face" sound in its name.    Medications Prior to Admission  Medication Sig Dispense Refill  . dicyclomine (BENTYL) 10 MG capsule Take 1 capsule (10 mg total) by mouth 3 (three) times daily before meals. 90 capsule 2  . docusate sodium (COLACE) 100 MG capsule Take 1 capsule (100 mg total) by mouth every 12 (twelve) hours. (Patient not taking: Reported on 12/18/2014) 60 capsule 0  . polyethylene glycol-electrolytes (NULYTELY/GOLYTELY) 420 G solution Take 4,000 mLs by mouth once. 4000 mL 0  . pramoxine (PROCTOFOAM) 1 % foam Place 1 application rectally 3 (three) times daily as needed for itching. (Patient not taking: Reported on 12/18/2014) 15 g 0    No results found for this or any previous visit (from the past 48 hour(s)). No results found.  ROS  Blood pressure 96/54, pulse 70, temperature 98.2 F (36.8 C), temperature source Oral, resp. rate 22, height 5' (1.524 m), weight 163 lb (73.936 kg), SpO2 99 %, currently breastfeeding. Physical Exam  Constitutional: She appears well-developed and well-nourished.  HENT:  Mouth/Throat: Oropharynx is clear and moist.  Eyes: Conjunctivae are normal. No scleral icterus.  Neck: No thyromegaly present.  Cardiovascular: Normal rate, regular rhythm and normal  heart sounds.   No murmur heard. Respiratory: Effort normal and breath sounds normal.  GI: Soft. She exhibits no distension and no mass. There is tenderness (mild tenderness at LLQ).  Musculoskeletal: She exhibits no edema.  Lymphadenopathy:    She has no cervical adenopathy.  Neurological: She is alert.  Skin: Skin is warm and dry.     Assessment/Plan Rectal bleeding abdominal pain and change in bowel habits. Diagnostic colonoscopy under monitored anesthesia care.  [name] [name] U 12/24/2014, 8:56 AM

## 2014-12-24 NOTE — Discharge Instructions (Signed)
No aspirin or NSAIDs for 1 week.  Take dicyclomine on as-needed basis. Anusol HC suppository 1 per rectum daily at bedtime for 2 weeks. Benefiber 4 g by mouth daily at bedtime. Physician will call with biopsy results.  Hemorrhoids Hemorrhoids are swollen veins around the rectum or anus. There are two types of hemorrhoids:  Internal hemorrhoids. These occur in the veins just inside the rectum. They may poke through to the outside and become irritated and painful. External hemorrhoids. These occur in the veins outside the anus and can be felt as a painful swelling or hard lump near the anus. CAUSES Pregnancy.  Obesity.  Constipation or diarrhea.  Straining to have a bowel movement.  Sitting for long periods on the toilet. Heavy lifting or other activity that caused you to strain. Anal intercourse. SYMPTOMS  Pain.  Anal itching or irritation.  Rectal bleeding.  Fecal leakage.  Anal swelling.  One or more lumps around the anus.  DIAGNOSIS  Your caregiver may be able to diagnose hemorrhoids by visual examination. Other examinations or tests that may be performed include:  Examination of the rectal area with a gloved hand (digital rectal exam).  Examination of anal canal using a small tube (scope).  A blood test if you have lost a significant amount of blood. A test to look inside the colon (sigmoidoscopy or colonoscopy). TREATMENT Most hemorrhoids can be treated at home. However, if symptoms do not seem to be getting better or if you have a lot of rectal bleeding, your caregiver may perform a procedure to help make the hemorrhoids get smaller or remove them completely. Possible treatments include:  Placing a rubber band at the base of the hemorrhoid to cut off the circulation (rubber band ligation).  Injecting a chemical to shrink the hemorrhoid (sclerotherapy).  Using a tool to burn the hemorrhoid (infrared light therapy).  Surgically removing the hemorrhoid  (hemorrhoidectomy).  Stapling the hemorrhoid to block blood flow to the tissue (hemorrhoid stapling).  HOME CARE INSTRUCTIONS  Eat foods with fiber, such as whole grains, beans, nuts, fruits, and vegetables. Ask your doctor about taking products with added fiber in them (fibersupplements). Increase fluid intake. Drink enough water and fluids to keep your urine clear or pale yellow.  Exercise regularly.  Go to the bathroom when you have the urge to have a bowel movement. Do not wait.  Avoid straining to have bowel movements.  Keep the anal area dry and clean. Use wet toilet paper or moist towelettes after a bowel movement.  Medicated creams and suppositories may be used or applied as directed.  Only take over-the-counter or prescription medicines as directed by your caregiver.  Take warm sitz baths for 15-20 minutes, 3-4 times a day to ease pain and discomfort.  Place ice packs on the hemorrhoids if they are tender and swollen. Using ice packs between sitz baths may be helpful.  Put ice in a plastic bag.  Place a towel between your skin and the bag.  Leave the ice on for 15-20 minutes, 3-4 times a day.  Do not use a donut-shaped pillow or sit on the toilet for long periods. This increases blood pooling and pain.  SEEK MEDICAL CARE IF: You have increasing pain and swelling that is not controlled by treatment or medicine. You have uncontrolled bleeding. You have difficulty or you are unable to have a bowel movement. You have pain or inflammation outside the area of the hemorrhoids. MAKE SURE YOU: Understand these instructions. Will watch your  condition. Will get help right away if you are not doing well or get worse. Document Released: 07/03/2000 Document Revised: 06/22/2012 Document Reviewed: 05/10/2012 [facility] Patient Information 2015 [place], [place]. This information is not intended to replace advice given to you by your health care provider. Make sure you discuss any  questions you have with your health care provider. Colon Polyps Polyps are lumps of extra tissue growing inside the body. Polyps can grow in the large intestine (colon). Most colon polyps are noncancerous (benign). However, some colon polyps can become cancerous over time. Polyps that are larger than a pea may be harmful. To be safe, caregivers remove and test all polyps. CAUSES  Polyps form when mutations in the genes cause your cells to grow and divide even though no more tissue is needed. RISK FACTORS There are a number of risk factors that can increase your chances of getting colon polyps. They include:  Being older than 50 years.  Family history of colon polyps or colon cancer.  Long-term colon diseases, such as colitis or Crohn disease.  Being overweight.  Smoking.  Being inactive.  Drinking too much alcohol. SYMPTOMS  Most small polyps do not cause symptoms. If symptoms are present, they may include:  Blood in the stool. The stool may look dark red or black.  Constipation or diarrhea that lasts longer than 1 week. DIAGNOSIS People often do not know they have polyps until their caregiver finds them during a regular checkup. Your caregiver can use 4 tests to check for polyps:  Digital rectal exam. The caregiver wears gloves and feels inside the rectum. This test would find polyps only in the rectum.  Barium enema. The caregiver puts a liquid called barium into your rectum before taking X-rays of your colon. Barium makes your colon look white. Polyps are dark, so they are easy to see in the X-ray pictures.  Sigmoidoscopy. A thin, flexible tube (sigmoidoscope) is placed into your rectum. The sigmoidoscope has a light and tiny camera in it. The caregiver uses the sigmoidoscope to look at the last third of your colon.  Colonoscopy. This test is like sigmoidoscopy, but the caregiver looks at the entire colon. This is the most common method for finding and removing  polyps. TREATMENT  Any polyps will be removed during a sigmoidoscopy or colonoscopy. The polyps are then tested for cancer. PREVENTION  To help lower your risk of getting more colon polyps:  Eat plenty of fruits and vegetables. Avoid eating fatty foods.  Do not smoke.  Avoid drinking alcohol.  Exercise every day.  Lose weight if recommended by your caregiver.  Eat plenty of calcium and folate. Foods that are rich in calcium include milk, cheese, and broccoli. Foods that are rich in folate include chickpeas, kidney beans, and spinach. HOME CARE INSTRUCTIONS Keep all follow-up appointments as directed by your caregiver. You may need periodic exams to check for polyps. SEEK MEDICAL CARE IF: You notice bleeding during a bowel movement. Document Released: 04/01/2004 Document Revised: 09/28/2011 Document Reviewed: 09/15/2011 [facility] Patient Information 2015 [place], [place]. This information is not intended to replace advice given to you by your health care provider. Make sure you discuss any questions you have with your health care provider. Colonoscopy, Care After Refer to this sheet in the next few weeks. These instructions provide you with information on caring for yourself after your procedure. Your health care provider may also give you more specific instructions. Your treatment has been planned according to current medical practices, but  problems sometimes occur. Call your health care provider if you have any problems or questions after your procedure. WHAT TO EXPECT AFTER THE PROCEDURE  After your procedure, it is typical to have the following:  A small amount of blood in your stool.  Moderate amounts of gas and mild abdominal cramping or bloating. HOME CARE INSTRUCTIONS  Do not drive, operate machinery, or sign important documents for 24 hours.  You may shower and resume your regular physical activities, but move at a slower pace for the first 24 hours.  Take frequent rest  periods for the first 24 hours.  Walk around or put a warm pack on your abdomen to help reduce abdominal cramping and bloating.  Drink enough fluids to keep your urine clear or pale yellow.  You may resume your normal diet as instructed by your health care provider. Avoid heavy or fried foods that are hard to digest.  Avoid drinking alcohol for 24 hours or as instructed by your health care provider.  Only take over-the-counter or prescription medicines as directed by your health care provider.  If a tissue sample (biopsy) was taken during your procedure:  Do not take aspirin or blood thinners for 7 days, or as instructed by your health care provider.  Do not drink alcohol for 7 days, or as instructed by your health care provider.  Eat soft foods for the first 24 hours. SEEK MEDICAL CARE IF: You have persistent spotting of blood in your stool 2-3 days after the procedure. SEEK IMMEDIATE MEDICAL CARE IF:  You have more than a small spotting of blood in your stool.  You pass large blood clots in your stool.  Your abdomen is swollen (distended).  You have nausea or vomiting.  You have a fever.  You have increasing abdominal pain that is not relieved with medicine. Document Released: 02/18/2004 Document Revised: 04/26/2013 Document Reviewed: 03/13/2013 [facility] Patient Information 2015 [place], [place]. This information is not intended to replace advice given to you by your health care provider. Make sure you discuss any questions you have with your health care provider.

## 2014-12-24 NOTE — Anesthesia Procedure Notes (Signed)
Procedure Name: MAC Date/Time: 12/24/2014 9:17 AM Performed by: Andree Elk, Audriella Blakeley A Pre-anesthesia Checklist: Patient identified, Timeout performed, Emergency Drugs available, Suction available and Patient being monitored Oxygen Delivery Method: Simple face mask

## 2014-12-24 NOTE — Transfer of Care (Signed)
Immediate Anesthesia Transfer of Care Note  Patient: [name] [name]  Procedure(s) Performed: Procedure(s) with comments: COLONOSCOPY WITH PROPOFOL (N/A) - total cecal time- 18 minutes POLYPECTOMY (N/A)  Patient Location: PACU  Anesthesia Type:MAC  Level of Consciousness: awake, oriented and patient cooperative  Airway & Oxygen Therapy: Patient Spontanous Breathing and Patient connected to nasal cannula oxygen  Post-op Assessment: Report given to RN and Post -op Vital signs reviewed and stable  Post vital signs: Reviewed and stable  Last Vitals:  Filed Vitals:   12/24/14 0915  BP: 87/49  Pulse:   Temp:   Resp: 28    Complications: No apparent anesthesia complications

## 2014-12-24 NOTE — Op Note (Signed)
COLONOSCOPY PROCEDURE REPORT  PATIENT:  [name]  MR#:  [id number] Birthdate:  1991/03/28, 24 y.o., female Endoscopist:  Dr. Rogene Houston, MD Referred By:  Dr. Ames Dura, MD  Procedure Date: 12/24/2014  Procedure:   Colonoscopy with snare polypectomy  Indications:  Patient is 24 year old Caucasian female who presents with history of rectal bleeding change in her bowel habits and lower abdominal pain.  Informed Consent:  The procedure and risks were reviewed with the patient and informed consent was obtained.  Medications:  Monitored anesthesia care. Please see anesthesia records for details.  Description of procedure:  After a digital rectal exam was performed, that colonoscope was advanced from the anus through the rectum and colon to the area of the cecum, ileocecal valve and appendiceal orifice. The cecum was deeply intubated. These structures were well-seen and photographed for the record. From the level of the cecum and ileocecal valve, the scope was slowly and cautiously withdrawn. The mucosal surfaces were carefully surveyed utilizing scope tip to flexion to facilitate fold flattening as needed. The scope was pulled down into the rectum where a thorough exam including retroflexion was performed. Terminal ileum was also examined.  Findings:   Prep excellent. Normal mucosa of terminal ileum. 15 mm broad-based polyp hot snared from ascending colon. Polyp was retrieved with Jabier Mutton net. Mucosa of rest of the colon and rectum was normal. Prominent hemorrhoids above and below the dentate line along with surface erosion.    Therapeutic/Diagnostic Maneuvers Performed:  See above  Complications:  None  Cecal Withdrawal Time:  18 minutes  Impression:  Normal mucosa of terminal ileum. 15 mm polyp hot snare from ascending colon. Internal and external hemorrhoids with inflammation.  Recommendations:  Standard instructions given. No aspirin or NSAIDs for 1 week. High  fiber diet. Use dicyclomine on as-needed basis. Benefiber 4 g by mouth daily at bedtime. Anusol HC suppository 1 per rectum daily at bedtime for two weeks. I will be contacting patient with biopsy results and arrange for referral to Dr. Romona Curls for further evaluation of hemorrhoids.  REHMAN,NAJEEB U  12/24/2014 10:00 AM  CC: Dr. Ames Dura, MD & Dr. Rayne Du ref. provider found

## 2014-12-24 NOTE — Anesthesia Postprocedure Evaluation (Signed)
  Anesthesia Post-op Note  Patient: [name] [name]  Procedure(s) Performed: Procedure(s) with comments: COLONOSCOPY WITH PROPOFOL (N/A) - total cecal time- 18 minutes POLYPECTOMY (N/A)  Patient Location: PACU  Anesthesia Type:MAC  Level of Consciousness: awake, oriented and patient cooperative  Airway and Oxygen Therapy: Patient Spontanous Breathing and Patient connected to nasal cannula oxygen  Post-op Pain: none  Post-op Assessment: Post-op Vital signs reviewed, Patient's Cardiovascular Status Stable, Respiratory Function Stable, Patent Airway, No signs of Nausea or vomiting and Pain level controlled  Post-op Vital Signs: Reviewed and stable  Last Vitals:  Filed Vitals:   12/24/14 0915  BP: 87/49  Pulse:   Temp:   Resp: 28    Complications: No apparent anesthesia complications

## 2014-12-24 NOTE — Anesthesia Preprocedure Evaluation (Addendum)
Anesthesia Evaluation  Patient identified by MRN, date of birth, ID band Patient awake    Reviewed: Allergy & Precautions, H&P , Patient's Chart, lab work & pertinent test results  Airway Mallampati: II  TM Distance: >3 FB Neck ROM: full    Dental  (+) Teeth Intact   Pulmonary asthma , Current Smoker,  breath sounds clear to auscultation        Cardiovascular negative cardio ROS  Rhythm:regular Rate:Normal     Neuro/Psych  Headaches, PSYCHIATRIC DISORDERS Depression ADHD    GI/Hepatic negative GI ROS,   Endo/Other    Renal/GU      Musculoskeletal   Abdominal   Peds  Hematology  (+) anemia ,   Anesthesia Other Findings       Reproductive/Obstetrics                            Anesthesia Physical Anesthesia Plan  ASA: II  Anesthesia Plan: MAC   Post-op Pain Management:    Induction: Intravenous  Airway Management Planned: Simple Face Mask  Additional Equipment:   Intra-op Plan:   Post-operative Plan:   Informed Consent: I have reviewed the patients History and Physical, chart, labs and discussed the procedure including the risks, benefits and alternatives for the proposed anesthesia with the patient or authorized representative who has indicated his/her understanding and acceptance.     Plan Discussed with:   Anesthesia Plan Comments:         Anesthesia Quick Evaluation

## 2014-12-25 ENCOUNTER — Telehealth (INDEPENDENT_AMBULATORY_CARE_PROVIDER_SITE_OTHER): Payer: Self-pay | Admitting: *Deleted

## 2014-12-25 ENCOUNTER — Encounter (HOSPITAL_COMMUNITY): Payer: Self-pay | Admitting: Internal Medicine

## 2014-12-25 NOTE — Telephone Encounter (Signed)
Had TCS 12/24/14 -- Dr Laural Golden prescribed Anusol HC suppository 1 per rectum daily at bedtime for two weeks -- patient states insurance (Medicaid) won't pay for this -- is there something over the counter she can take

## 2014-12-25 NOTE — Telephone Encounter (Signed)
Forwarded to Dr.Rehman to address. 

## 2014-12-26 NOTE — Telephone Encounter (Signed)
She can try Proctofoam HC 2.5% twice a day Please call patient and prescription and she needs appointment with Dr. Romona Curls for treatment for hemorrhoids.[name] [name]

## 2014-12-26 NOTE — Telephone Encounter (Signed)
appt with Dr Inez Pilgrim has already been sch'd for 01/01/15, spoke to patient earlier this week

## 2014-12-26 NOTE — Telephone Encounter (Signed)
Proctofoam HC 2.5.% Apply to rectum twice daily . 1 refill . This was called to [facility]. Patient was called and made aware.   Patient states that she has hurt more in her stomach since the procedure than she did prior to the procedure. Patient states that she has not had a BM, it was recommended that she use Colace at bedtime. I told her that I would review this with the provider and if there was any other recommendations we would call her back.  Ann - please refer to [facility] for hemorrhoids per Dr.Rehman.

## 2014-12-28 ENCOUNTER — Encounter (INDEPENDENT_AMBULATORY_CARE_PROVIDER_SITE_OTHER): Payer: Self-pay | Admitting: *Deleted

## 2015-01-30 ENCOUNTER — Ambulatory Visit (INDEPENDENT_AMBULATORY_CARE_PROVIDER_SITE_OTHER): Payer: Medicaid Other | Admitting: Internal Medicine

## 2015-03-20 ENCOUNTER — Encounter (INDEPENDENT_AMBULATORY_CARE_PROVIDER_SITE_OTHER): Payer: Self-pay | Admitting: *Deleted

## 2015-07-21 NOTE — L&D Delivery Note (Signed)
Delivery Note After a 1 push 2nd stage, At 8:00 PM a viable female was delivered via Vaginal, Spontaneous Delivery (Presentation: Right Occiput Anterior).  APGAR 8/9: , ; weight pending.  After 2 minutes, the cord was clamped and cut. 40 units of pitocin diluted in 1000cc LR was infused rapidly IV.  The placenta separated spontaneously and delivered via CCT and maternal pushing effort.  It was inspected and appears to be intact with a 3 VC.  Anesthesia: Epidural  Episiotomy:   Lacerations:   Suture Repair:  Est. Blood Loss (mL):  100  Mom to postpartum.  Baby to Couplet care / Skin to Skin.  CRESENZO-DISHMAN,Jolly Carlini 12/09/2015, 8:12 PM

## 2015-09-05 ENCOUNTER — Ambulatory Visit (INDEPENDENT_AMBULATORY_CARE_PROVIDER_SITE_OTHER): Payer: Medicaid Other | Admitting: Adult Health

## 2015-09-05 ENCOUNTER — Encounter: Payer: Self-pay | Admitting: Adult Health

## 2015-09-05 VITALS — BP 112/60 | HR 90 | Ht 60.0 in | Wt 160.0 lb

## 2015-09-05 DIAGNOSIS — Z3490 Encounter for supervision of normal pregnancy, unspecified, unspecified trimester: Secondary | ICD-10-CM

## 2015-09-05 DIAGNOSIS — Z3201 Encounter for pregnancy test, result positive: Secondary | ICD-10-CM | POA: Diagnosis not present

## 2015-09-05 DIAGNOSIS — N926 Irregular menstruation, unspecified: Secondary | ICD-10-CM

## 2015-09-05 DIAGNOSIS — Z363 Encounter for antenatal screening for malformations: Secondary | ICD-10-CM

## 2015-09-05 HISTORY — DX: Encounter for supervision of normal pregnancy, unspecified, unspecified trimester: Z34.90

## 2015-09-05 LAB — POCT URINE PREGNANCY: Preg Test, Ur: POSITIVE — AB

## 2015-09-05 MED ORDER — PRENATAL PLUS 27-1 MG PO TABS: 1.0000 | ORAL_TABLET | Freq: Every day | ORAL | Status: AC

## 2015-09-05 NOTE — Progress Notes (Addendum)
Subjective:     Patient ID: [name] [name], female   DOB: 12-08-1990, 25 y.o.   MRN: [id number]  HPI [name] [name] is a 25 year old white female in for UPT, has has not had a period, and she is not sure when last one was, and started feeling movement in late December.  Review of Systems Patient denies any headaches, hearing loss, fatigue, blurred vision, shortness of breath, chest pain, abdominal pain, problems with bowel movements, urination, or intercourse. No joint pain or mood swings.She had tooth pulled Monday and took 1 hydrocodone it was her dad's.    Objective:   Physical Exam BP 112/60 mmHg  Pulse 90  Ht 5' (1.524 m)  Wt 160 lb (72.576 kg)  BMI 31.25 kg/m2  LMP 12/16/2014  Breastfeeding? No UPT +, FH about 26 cms and FHR 164, about 26 weeks with EDD 03/06/16 by size, will get [place] in 1 week for anatomy and dating, medicaid form given ,Skin warm and dry. Neck: mid line trachea, normal thyroid, good ROM, no lymphadenopathy noted. Lungs: clear to ausculation bilaterally. Cardiovascular: regular rate and rhythm.Abdomen soft and about 26 weeks in size.Try to stop smoking.No more hydrocodone.     Assessment:      UPT + Pregnant     Plan:     Rx prenatal plus #30 take 1 daily, disp #30, with 11 refills   Return in 1 week for anatomy US  Review handouts on second and third trimester

## 2015-09-05 NOTE — Patient Instructions (Signed)
Third Trimester of Pregnancy The third trimester is from week 29 through week 42, months 7 through 9. The third trimester is a time when the fetus is growing rapidly. At the end of the ninth month, the fetus is about 20 inches in length and weighs 6-10 pounds.  BODY CHANGES Your body goes through many changes during pregnancy. The changes vary from woman to woman.   Your weight will continue to increase. You can expect to gain 25-35 pounds (11-16 kg) by the end of the pregnancy.  You may begin to get stretch marks on your hips, abdomen, and breasts.  You may urinate more often because the fetus is moving lower into your pelvis and pressing on your bladder.  You may develop or continue to have heartburn as a result of your pregnancy.  You may develop constipation because certain hormones are causing the muscles that push waste through your intestines to slow down.  You may develop hemorrhoids or swollen, bulging veins (varicose veins).  You may have pelvic pain because of the weight gain and pregnancy hormones relaxing your joints between the bones in your pelvis. Backaches may result from overexertion of the muscles supporting your posture.  You may have changes in your hair. These can include thickening of your hair, rapid growth, and changes in texture. Some women also have hair loss during or after pregnancy, or hair that feels dry or thin. Your hair will most likely return to normal after your baby is born.  Your breasts will continue to grow and be tender. A yellow discharge may leak from your breasts called colostrum.  Your belly button may stick out.  You may feel short of breath because of your expanding uterus.  You may notice the fetus "dropping," or moving lower in your abdomen.  You may have a bloody mucus discharge. This usually occurs a few days to a week before labor begins.  Your cervix becomes thin and soft (effaced) near your due date. WHAT TO EXPECT AT YOUR PRENATAL  EXAMS  You will have prenatal exams every 2 weeks until week 36. Then, you will have weekly prenatal exams. During a routine prenatal visit:  You will be weighed to make sure you and the fetus are growing normally.  Your blood pressure is taken.  Your abdomen will be measured to track your baby's growth.  The fetal heartbeat will be listened to.  Any test results from the previous visit will be discussed.  You may have a cervical check near your due date to see if you have effaced. At around 36 weeks, your caregiver will check your cervix. At the same time, your caregiver will also perform a test on the secretions of the vaginal tissue. This test is to determine if a type of bacteria, Group B streptococcus, is present. Your caregiver will explain this further. Your caregiver may ask you:  What your birth plan is.  How you are feeling.  If you are feeling the baby move.  If you have had any abnormal symptoms, such as leaking fluid, bleeding, severe headaches, or abdominal cramping.  If you are using any tobacco products, including cigarettes, chewing tobacco, and electronic cigarettes.  If you have any questions. Other tests or screenings that may be performed during your third trimester include:  Blood tests that check for low iron levels (anemia).  Fetal testing to check the health, activity level, and growth of the fetus. Testing is done if you have certain medical conditions or if  there are problems during the pregnancy.  HIV (human immunodeficiency virus) testing. If you are at high risk, you may be screened for HIV during your third trimester of pregnancy. FALSE LABOR You may feel small, irregular contractions that eventually go away. These are called Braxton Hicks contractions, or false labor. Contractions may last for hours, days, or even weeks before true labor sets in. If contractions come at regular intervals, intensify, or become painful, it is best to be seen by your  caregiver.  SIGNS OF LABOR   Menstrual-like cramps.  Contractions that are 5 minutes apart or less.  Contractions that start on the top of the uterus and spread down to the lower abdomen and back.  A sense of increased pelvic pressure or back pain.  A watery or bloody mucus discharge that comes from the vagina. If you have any of these signs before the 37th week of pregnancy, call your caregiver right away. You need to go to the hospital to get checked immediately. HOME CARE INSTRUCTIONS   Avoid all smoking, herbs, alcohol, and unprescribed drugs. These chemicals affect the formation and growth of the baby.  Do not use any tobacco products, including cigarettes, chewing tobacco, and electronic cigarettes. If you need help quitting, ask your health care provider. You may receive counseling support and other resources to help you quit.  Follow your caregiver's instructions regarding medicine use. There are medicines that are either safe or unsafe to take during pregnancy.  Exercise only as directed by your caregiver. Experiencing uterine cramps is a good sign to stop exercising.  Continue to eat regular, healthy meals.  Wear a good support bra for breast tenderness.  Do not use hot tubs, steam rooms, or saunas.  Wear your seat belt at all times when driving.  Avoid raw meat, uncooked cheese, cat litter boxes, and soil used by cats. These carry germs that can cause birth defects in the baby.  Take your prenatal vitamins.  Take 1500-2000 mg of calcium daily starting at the 20th week of pregnancy until you deliver your baby.  Try taking a stool softener (if your caregiver approves) if you develop constipation. Eat more high-fiber foods, such as fresh vegetables or fruit and whole grains. Drink plenty of fluids to keep your urine clear or pale yellow.  Take warm sitz baths to soothe any pain or discomfort caused by hemorrhoids. Use hemorrhoid cream if your caregiver approves.  If  you develop varicose veins, wear support hose. Elevate your feet for 15 minutes, 3-4 times a day. Limit salt in your diet.  Avoid heavy lifting, wear low heal shoes, and practice good posture.  Rest a lot with your legs elevated if you have leg cramps or low back pain.  Visit your dentist if you have not gone during your pregnancy. Use a soft toothbrush to brush your teeth and be gentle when you floss.  A sexual relationship may be continued unless your caregiver directs you otherwise.  Do not travel far distances unless it is absolutely necessary and only with the approval of your caregiver.  Take prenatal classes to understand, practice, and ask questions about the labor and delivery.  Make a trial run to the hospital.  Pack your hospital bag.  Prepare the baby's nursery.  Continue to go to all your prenatal visits as directed by your caregiver. SEEK MEDICAL CARE IF:  You are unsure if you are in labor or if your water has broken.  You have dizziness.  You have  mild pelvic cramps, pelvic pressure, or nagging pain in your abdominal area.  You have persistent nausea, vomiting, or diarrhea.  You have a bad smelling vaginal discharge.  You have pain with urination. SEEK IMMEDIATE MEDICAL CARE IF:   You have a fever.  You are leaking fluid from your vagina.  You have spotting or bleeding from your vagina.  You have severe abdominal cramping or pain.  You have rapid weight loss or gain.  You have shortness of breath with chest pain.  You notice sudden or extreme swelling of your face, hands, ankles, feet, or legs.  You have not felt your baby move in over an hour.  You have severe headaches that do not go away with medicine.  You have vision changes.   This information is not intended to replace advice given to you by your health care provider. Make sure you discuss any questions you have with your health care provider.   Document Released: 06/30/2001 Document  Revised: 07/27/2014 Document Reviewed: 09/06/2012 Elsevier Interactive Patient Education 2016 [place] of Pregnancy The second trimester is from week 13 through week 28, months 4 through 6. The second trimester is often a time when you feel your best. Your body has also adjusted to being pregnant, and you begin to feel better physically. Usually, morning sickness has lessened or quit completely, you may have more energy, and you may have an increase in appetite. The second trimester is also a time when the fetus is growing rapidly. At the end of the sixth month, the fetus is about 9 inches long and weighs about 1 pounds. You will likely begin to feel the baby move (quickening) between 18 and 20 weeks of the pregnancy. BODY CHANGES Your body goes through many changes during pregnancy. The changes vary from woman to woman.   Your weight will continue to increase. You will notice your lower abdomen bulging out.  You may begin to get stretch marks on your hips, abdomen, and breasts.  You may develop headaches that can be relieved by medicines approved by your health care provider.  You may urinate more often because the fetus is pressing on your bladder.  You may develop or continue to have heartburn as a result of your pregnancy.  You may develop constipation because certain hormones are causing the muscles that push waste through your intestines to slow down.  You may develop hemorrhoids or swollen, bulging veins (varicose veins).  You may have back pain because of the weight gain and pregnancy hormones relaxing your joints between the bones in your pelvis and as a result of a shift in weight and the muscles that support your balance.  Your breasts will continue to grow and be tender.  Your gums may bleed and may be sensitive to brushing and flossing.  Dark spots or blotches (chloasma, mask of pregnancy) may develop on your face. This will likely fade after the baby  is born.  A dark line from your belly button to the pubic area (linea nigra) may appear. This will likely fade after the baby is born.  You may have changes in your hair. These can include thickening of your hair, rapid growth, and changes in texture. Some women also have hair loss during or after pregnancy, or hair that feels dry or thin. Your hair will most likely return to normal after your baby is born. WHAT TO EXPECT AT YOUR PRENATAL VISITS During a routine prenatal visit:  You will be  weighed to make sure you and the fetus are growing normally.  Your blood pressure will be taken.  Your abdomen will be measured to track your baby's growth.  The fetal heartbeat will be listened to.  Any test results from the previous visit will be discussed. Your health care provider may ask you:  How you are feeling.  If you are feeling the baby move.  If you have had any abnormal symptoms, such as leaking fluid, bleeding, severe headaches, or abdominal cramping.  If you are using any tobacco products, including cigarettes, chewing tobacco, and electronic cigarettes.  If you have any questions. Other tests that may be performed during your second trimester include:  Blood tests that check for:  Low iron levels (anemia).  Gestational diabetes (between 24 and 28 weeks).  Rh antibodies.  Urine tests to check for infections, diabetes, or protein in the urine.  An ultrasound to confirm the proper growth and development of the baby.  An amniocentesis to check for possible genetic problems.  Fetal screens for spina bifida and Down syndrome.  HIV (human immunodeficiency virus) testing. Routine prenatal testing includes screening for HIV, unless you choose not to have this test. HOME CARE INSTRUCTIONS   Avoid all smoking, herbs, alcohol, and unprescribed drugs. These chemicals affect the formation and growth of the baby.  Do not use any tobacco products, including cigarettes, chewing  tobacco, and electronic cigarettes. If you need help quitting, ask your health care provider. You may receive counseling support and other resources to help you quit.  Follow your health care provider's instructions regarding medicine use. There are medicines that are either safe or unsafe to take during pregnancy.  Exercise only as directed by your health care provider. Experiencing uterine cramps is a good sign to stop exercising.  Continue to eat regular, healthy meals.  Wear a good support bra for breast tenderness.  Do not use hot tubs, steam rooms, or saunas.  Wear your seat belt at all times when driving.  Avoid raw meat, uncooked cheese, cat litter boxes, and soil used by cats. These carry germs that can cause birth defects in the baby.  Take your prenatal vitamins.  Take 1500-2000 mg of calcium daily starting at the 20th week of pregnancy until you deliver your baby.  Try taking a stool softener (if your health care provider approves) if you develop constipation. Eat more high-fiber foods, such as fresh vegetables or fruit and whole grains. Drink plenty of fluids to keep your urine clear or pale yellow.  Take warm sitz baths to soothe any pain or discomfort caused by hemorrhoids. Use hemorrhoid cream if your health care provider approves.  If you develop varicose veins, wear support hose. Elevate your feet for 15 minutes, 3-4 times a day. Limit salt in your diet.  Avoid heavy lifting, wear low heel shoes, and practice good posture.  Rest with your legs elevated if you have leg cramps or low back pain.  Visit your dentist if you have not gone yet during your pregnancy. Use a soft toothbrush to brush your teeth and be gentle when you floss.  A sexual relationship may be continued unless your health care provider directs you otherwise.  Continue to go to all your prenatal visits as directed by your health care provider. SEEK MEDICAL CARE IF:   You have dizziness.  You  have mild pelvic cramps, pelvic pressure, or nagging pain in the abdominal area.  You have persistent nausea, vomiting, or diarrhea.  You have a bad smelling vaginal discharge.  You have pain with urination. SEEK IMMEDIATE MEDICAL CARE IF:   You have a fever.  You are leaking fluid from your vagina.  You have spotting or bleeding from your vagina.  You have severe abdominal cramping or pain.  You have rapid weight gain or loss.  You have shortness of breath with chest pain.  You notice sudden or extreme swelling of your face, hands, ankles, feet, or legs.  You have not felt your baby move in over an hour.  You have severe headaches that do not go away with medicine.  You have vision changes.   This information is not intended to replace advice given to you by your health care provider. Make sure you discuss any questions you have with your health care provider.   Document Released: 06/30/2001 Document Revised: 07/27/2014 Document Reviewed: 09/06/2012 Elsevier Interactive Patient Education [employer]. Return in 1 week for [place]

## 2015-09-12 ENCOUNTER — Ambulatory Visit (INDEPENDENT_AMBULATORY_CARE_PROVIDER_SITE_OTHER): Payer: Medicaid Other | Admitting: Obstetrics & Gynecology

## 2015-09-12 ENCOUNTER — Ambulatory Visit (INDEPENDENT_AMBULATORY_CARE_PROVIDER_SITE_OTHER): Payer: Medicaid Other

## 2015-09-12 DIAGNOSIS — Z3A29 29 weeks gestation of pregnancy: Secondary | ICD-10-CM | POA: Diagnosis not present

## 2015-09-12 DIAGNOSIS — Z363 Encounter for antenatal screening for malformations: Secondary | ICD-10-CM

## 2015-09-12 DIAGNOSIS — Z36 Encounter for antenatal screening of mother: Secondary | ICD-10-CM | POA: Diagnosis not present

## 2015-09-12 DIAGNOSIS — Z3493 Encounter for supervision of normal pregnancy, unspecified, third trimester: Secondary | ICD-10-CM

## 2015-09-12 NOTE — Progress Notes (Signed)
[place] [id number] wks,cephalic,fhr 123456 bpm,ant pl gr 1,afi 15 cm,efw [id number] g,cx 3.3cm,anatomy complete,no obvious abn seen

## 2015-09-17 ENCOUNTER — Encounter (HOSPITAL_COMMUNITY): Payer: Self-pay

## 2015-09-17 DIAGNOSIS — J45909 Unspecified asthma, uncomplicated: Secondary | ICD-10-CM | POA: Insufficient documentation

## 2015-09-17 DIAGNOSIS — Z8659 Personal history of other mental and behavioral disorders: Secondary | ICD-10-CM | POA: Diagnosis not present

## 2015-09-17 DIAGNOSIS — Z79899 Other long term (current) drug therapy: Secondary | ICD-10-CM | POA: Insufficient documentation

## 2015-09-17 DIAGNOSIS — O9989 Other specified diseases and conditions complicating pregnancy, childbirth and the puerperium: Secondary | ICD-10-CM | POA: Diagnosis not present

## 2015-09-17 DIAGNOSIS — Z8679 Personal history of other diseases of the circulatory system: Secondary | ICD-10-CM | POA: Diagnosis not present

## 2015-09-17 DIAGNOSIS — O99513 Diseases of the respiratory system complicating pregnancy, third trimester: Secondary | ICD-10-CM | POA: Insufficient documentation

## 2015-09-17 DIAGNOSIS — Z3A29 29 weeks gestation of pregnancy: Secondary | ICD-10-CM | POA: Insufficient documentation

## 2015-09-17 DIAGNOSIS — J029 Acute pharyngitis, unspecified: Secondary | ICD-10-CM | POA: Insufficient documentation

## 2015-09-17 DIAGNOSIS — M791 Myalgia: Secondary | ICD-10-CM | POA: Insufficient documentation

## 2015-09-17 DIAGNOSIS — Z87891 Personal history of nicotine dependence: Secondary | ICD-10-CM | POA: Diagnosis not present

## 2015-09-17 NOTE — ED Notes (Addendum)
Complains of body aches with sore throat. Patient reports of [redacted] weeks pregnant. Denies pregnancy complaints. Reports of normal amount of fetal movement today.

## 2015-09-18 ENCOUNTER — Emergency Department (HOSPITAL_COMMUNITY)
Admission: EM | Admit: 2015-09-18 | Discharge: 2015-09-18 | Disposition: A | Discharge status: Home / Self Care | Payer: Medicaid Other | Attending: Emergency Medicine | Admitting: Emergency Medicine

## 2015-09-18 ENCOUNTER — Encounter: Payer: Self-pay | Admitting: Advanced Practice Midwife

## 2015-09-18 ENCOUNTER — Telehealth: Payer: Self-pay | Admitting: Advanced Practice Midwife

## 2015-09-18 DIAGNOSIS — R6889 Other general symptoms and signs: Secondary | ICD-10-CM

## 2015-09-18 LAB — RAPID STREP SCREEN (MED CTR MEBANE ONLY): STREPTOCOCCUS, GROUP A SCREEN (DIRECT): NEGATIVE

## 2015-09-18 MED ORDER — OSELTAMIVIR PHOSPHATE 75 MG PO CAPS: 75.0000 mg | ORAL_CAPSULE | Freq: Two times a day (BID) | ORAL | Status: DC

## 2015-09-18 NOTE — Discharge Instructions (Signed)
You were seen today for flulike symptoms. Your strep screen is negative. You will be given a prescription for Tamiflu. Follow up with her OB regarding medications that are safe for symptom control for her cough.  Viral Infections A viral infection can be caused by different types of viruses.Most viral infections are not serious and resolve on their own. However, some infections may cause severe symptoms and may lead to further complications. SYMPTOMS Viruses can frequently cause:  Minor sore throat.  Aches and pains.  Headaches.  Runny nose.  Different types of rashes.  Watery eyes.  Tiredness.  Cough.  Loss of appetite.  Gastrointestinal infections, resulting in nausea, vomiting, and diarrhea. These symptoms do not respond to antibiotics because the infection is not caused by bacteria. However, you might catch a bacterial infection following the viral infection. This is sometimes called a "superinfection." Symptoms of such a bacterial infection may include:  Worsening sore throat with pus and difficulty swallowing.  Swollen neck glands.  Chills and a high or persistent fever.  Severe headache.  Tenderness over the sinuses.  Persistent overall ill feeling (malaise), muscle aches, and tiredness (fatigue).  Persistent cough.  Yellow, green, or brown mucus production with coughing. HOME CARE INSTRUCTIONS   Only take over-the-counter or prescription medicines for pain, discomfort, diarrhea, or fever as directed by your caregiver.  Drink enough water and fluids to keep your urine clear or pale yellow. Sports drinks can provide valuable electrolytes, sugars, and hydration.  Get plenty of rest and maintain proper nutrition. Soups and broths with crackers or rice are fine. SEEK IMMEDIATE MEDICAL CARE IF:   You have severe headaches, shortness of breath, chest pain, neck pain, or an unusual rash.  You have uncontrolled vomiting, diarrhea, or you are unable to keep down  fluids.  You or your child has an oral temperature above 102 F (38.9 C), not controlled by medicine.  Your baby is older than 3 months with a rectal temperature of 102 F (38.9 C) or higher.  Your baby is 22 months old or younger with a rectal temperature of 100.4 F (38 C) or higher. MAKE SURE YOU:   Understand these instructions.  Will watch your condition.  Will get help right away if you are not doing well or get worse.   This information is not intended to replace advice given to you by your health care provider. Make sure you discuss any questions you have with your health care provider.   Document Released: 04/15/2005 Document Revised: 09/28/2011 Document Reviewed: 12/12/2014 Elsevier Interactive Patient Education [employer].

## 2015-09-18 NOTE — Telephone Encounter (Signed)
Pt states that she is having flu like symptoms, pt states that she went to [facility] ER yesterday and they told her they couldn't treat her because she is pregnant. Pt is also is having a bad cough. Pt has rescheduled her appointment for 09/25/2015. Please contact pt

## 2015-09-18 NOTE — Telephone Encounter (Signed)
Pt went to ED yesterday with body aches and sore throat. No documented fever.  Given rx for Tamiflu, but hasn't filled it.  Missed her appt today (was >30 minutes late for [facility]).  Talked w/pt's mom.  She can fill Tamiful and take, or go back to ED for flu swab ([facility] across the hall doesn't do the test and we don't have swabs in the office either. Marland Kitchen).

## 2015-09-18 NOTE — ED Provider Notes (Signed)
CSN: [id number]     Arrival date & time 09/17/15  2222 History   First MD Initiated Contact with Patient 09/18/15 0116     Chief Complaint  Patient presents with  . Generalized Body Aches     (Consider location/radiation/quality/duration/timing/severity/associated sxs/prior Treatment) HPI  This a 25 year old female currently [redacted] weeks pregnant who presents with flulike symptoms. Patient states that over the last 2 days she has had generalized bodyaches and sore throats. She's had chills without documented fevers. She also reports a nonproductive cough. She has tried Tylenol and Robitussin at home without any relief. Patient denies any nausea, vomiting, abdominal pain. She reports good fetal movement. No loss of fluids or vaginal bleeding. She has a follow-up OB appointment tomorrow with her doctor. She did not get a flu vaccine.  Past Medical History  Diagnosis Date  . Asthma   . ADHD   . Migraines     with aura (see flashes)  . Pregnant 09/05/2015   Past Surgical History  Procedure Laterality Date  . Dilation and curettage of uterus    . Wisdom tooth extraction    . Jaw wired shut    . Penny removed from esophagus    . Colonoscopy with propofol N/A 12/24/2014    Procedure: COLONOSCOPY WITH PROPOFOL;  Surgeon: Rogene Houston, MD;  Location: [facility];  Service: Endoscopy;  Laterality: N/A;  total cecal time- 18 minutes  . Polypectomy N/A 12/24/2014    Procedure: POLYPECTOMY;  Surgeon: Rogene Houston, MD;  Location: [facility];  Service: Endoscopy;  Laterality: N/A;  . Tooth pulled     Family History  Problem Relation Age of Onset  . Diabetes Mother   . Liver disease Mother     fatty liver   . Hypertension Father   . COPD Father   . Hypertension Brother   . COPD Brother   . Heart disease Maternal Grandmother   . Diabetes Maternal Grandmother   . Heart murmur Son   . Other Son     oversized tonsils   Social History  Substance Use Topics  . Smoking status: Former Smoker -- 0.00  packs/day for 10 years    Types: Cigarettes    Quit date: 01/08/2012  . Smokeless tobacco: Never Used  . Alcohol Use: No     Comment: every now and then; not now   OB History    Gravida Para Term Preterm AB TAB SAB Ectopic Multiple Living   5 3 3  1  1   3      Review of Systems  Constitutional: Positive for chills. Negative for fever.  HENT: Positive for sore throat.   Respiratory: Positive for cough. Negative for chest tightness and shortness of breath.   Cardiovascular: Negative for chest pain.  Gastrointestinal: Negative for nausea, vomiting and abdominal pain.  Genitourinary: Negative for vaginal bleeding and vaginal discharge.  Musculoskeletal: Positive for myalgias.  All other systems reviewed and are negative.     Allergies  Darvocet; Imitrex; Adhesive; Codeine; Percocet; and Sulfa antibiotics  Home Medications   Prior to Admission medications   Medication Sig Start Date End Date Taking? Authorizing Provider  oseltamivir (TAMIFLU) 75 MG capsule Take 1 capsule (75 mg total) by mouth every 12 (twelve) hours. 09/18/15   Merryl Hacker, MD  prenatal vitamin w/FE, FA (PRENATAL 1 + 1) 27-1 MG TABS tablet Take 1 tablet by mouth daily at 12 noon. 09/05/15   Estill Dooms, NP   BP 103/58 mmHg  Pulse 93  Temp(Src) 98 F (36.7 C) (Oral)  Resp 18  Ht 5' (1.524 m)  Wt 160 lb (72.576 kg)  BMI 31.25 kg/m2  SpO2 100%  LMP  (LMP Unknown) Physical Exam  Constitutional: She is oriented to person, place, and time. She appears well-developed and well-nourished. No distress.  HENT:  Head: Normocephalic and atraumatic.  Mouth/Throat: Oropharynx is clear and moist. No oropharyngeal exudate.  Mild posterior pharynx erythema, uvula midline, no significant tonsillar enlargement  Eyes: Pupils are equal, round, and reactive to light.  Cardiovascular: Normal rate, regular rhythm and normal heart sounds.   No murmur heard. Pulmonary/Chest: Effort normal and breath sounds normal. No  respiratory distress. She has no wheezes.  Abdominal: Soft. Bowel sounds are normal. There is no tenderness. There is no rebound.  Gravid  Neurological: She is alert and oriented to person, place, and time.  Skin: Skin is warm and dry.  Psychiatric: She has a normal mood and affect.  Nursing note and vitals reviewed.   ED Course  Procedures (including critical care time) Labs Review Labs Reviewed  RAPID STREP SCREEN (NOT AT [facility])  CULTURE, GROUP A STREP [facility])    Imaging Review No results found. I have personally reviewed and evaluated these images and lab results as part of my medical decision-making.   EKG Interpretation None      MDM   Final diagnoses:  Flu-like symptoms   Patient presents with flulike symptoms. Nontoxic-appearing. Afebrile. She is currently pregnant. Physical exam is largely unremarkable. Strep screen is negative.  Patient does report sick contacts. This is likely a viral illness. Flu is a consideration. Discussed with patient in. Treatment with Tamiflu given her pregnancy. Her pulmonary exam is reassuring and will not obtain an x-ray at this time. She needs to follow-up with her OB regarding safe medications to take for symptom control including cough.  After history, exam, and medical workup I feel the patient has been appropriately medically screened and is safe for discharge home. Pertinent diagnoses were discussed with the patient. Patient was given return precautions.   Merryl Hacker, MD 09/18/15 [phone]

## 2015-09-20 LAB — CULTURE, GROUP A STREP (THRC)

## 2015-09-24 ENCOUNTER — Encounter: Payer: Self-pay | Admitting: Women's Health

## 2015-09-25 ENCOUNTER — Other Ambulatory Visit (HOSPITAL_COMMUNITY)
Admission: RE | Admit: 2015-09-25 | Discharge: 2015-09-25 | Disposition: A | Discharge status: Home / Self Care | Payer: Medicaid Other | Source: Ambulatory Visit | Attending: Advanced Practice Midwife | Admitting: Advanced Practice Midwife

## 2015-09-25 ENCOUNTER — Ambulatory Visit (INDEPENDENT_AMBULATORY_CARE_PROVIDER_SITE_OTHER): Payer: Medicaid Other | Admitting: Advanced Practice Midwife

## 2015-09-25 ENCOUNTER — Encounter: Payer: Self-pay | Admitting: Advanced Practice Midwife

## 2015-09-25 VITALS — BP 110/58 | HR 98 | Wt 166.0 lb

## 2015-09-25 DIAGNOSIS — N719 Inflammatory disease of uterus, unspecified: Secondary | ICD-10-CM

## 2015-09-25 DIAGNOSIS — Z331 Pregnant state, incidental: Secondary | ICD-10-CM

## 2015-09-25 DIAGNOSIS — Z113 Encounter for screening for infections with a predominantly sexual mode of transmission: Secondary | ICD-10-CM | POA: Diagnosis present

## 2015-09-25 DIAGNOSIS — Z01411 Encounter for gynecological examination (general) (routine) with abnormal findings: Secondary | ICD-10-CM | POA: Diagnosis present

## 2015-09-25 DIAGNOSIS — Z0283 Encounter for blood-alcohol and blood-drug test: Secondary | ICD-10-CM

## 2015-09-25 DIAGNOSIS — Z3493 Encounter for supervision of normal pregnancy, unspecified, third trimester: Secondary | ICD-10-CM | POA: Diagnosis not present

## 2015-09-25 DIAGNOSIS — Z369 Encounter for antenatal screening, unspecified: Secondary | ICD-10-CM

## 2015-09-25 DIAGNOSIS — F5089 Other specified eating disorder: Secondary | ICD-10-CM

## 2015-09-25 DIAGNOSIS — Z1151 Encounter for screening for human papillomavirus (HPV): Secondary | ICD-10-CM | POA: Diagnosis present

## 2015-09-25 DIAGNOSIS — Z131 Encounter for screening for diabetes mellitus: Secondary | ICD-10-CM

## 2015-09-25 DIAGNOSIS — Z3490 Encounter for supervision of normal pregnancy, unspecified, unspecified trimester: Secondary | ICD-10-CM | POA: Insufficient documentation

## 2015-09-25 DIAGNOSIS — Z124 Encounter for screening for malignant neoplasm of cervix: Secondary | ICD-10-CM

## 2015-09-25 DIAGNOSIS — Z1389 Encounter for screening for other disorder: Secondary | ICD-10-CM

## 2015-09-25 NOTE — Patient Instructions (Signed)

## 2015-09-25 NOTE — Progress Notes (Signed)
Subjective:    [name] [name] is a [id number] [redacted]w[redacted]d being seen today for her first obstetrical visit.  Her obstetrical history is significant for macrosomia, late PNC at 28 weeks.  Had SD with smaller (1st baby), no problems with 10# 13 oz baby.   Pregnancy history fully reviewed.  Patient reports no complaints.  Filed Vitals:   09/25/15 1501  BP: 110/58  Pulse: 98  Weight: 166 lb (75.297 kg)    HISTORY: OB History  Gravida Para Term Preterm AB SAB TAB Ectopic Multiple Living  5 3 3  1 1    3     # Outcome Date GA Lbr Len/2nd Weight Sex Delivery Anes PTL Lv  5 Current           4 Term 01/07/14 [redacted]w[redacted]d 07:04 8 lb 9.6 oz (3.901 kg) F Vag-Spont EPI  Y  3 Term 07/19/11 [redacted]w[redacted]d 06:03 / 00:40 10 lb 3.7 oz (4.641 kg) M Vag-Spont EPI  Y     Comments: wnl  2 Term 09/06/08 [redacted]w[redacted]d  8 lb 14 oz (4.026 kg) F Vag-Spont EPI  Y  1 SAB              Past Medical History  Diagnosis Date  . Asthma   . Migraines     with aura (see flashes)  . Pregnant 09/05/2015  . ADHD    Past Surgical History  Procedure Laterality Date  . Dilation and curettage of uterus    . Wisdom tooth extraction    . Jaw wired shut    . Penny removed from esophagus    . Colonoscopy with propofol N/A 12/24/2014    Procedure: COLONOSCOPY WITH PROPOFOL;  Surgeon: Rogene Houston, MD;  Location: [facility];  Service: Endoscopy;  Laterality: N/A;  total cecal time- 18 minutes  . Polypectomy N/A 12/24/2014    Procedure: POLYPECTOMY;  Surgeon: Rogene Houston, MD;  Location: [facility];  Service: Endoscopy;  Laterality: N/A;  . Tooth pulled     Family History  Problem Relation Age of Onset  . Diabetes Mother   . Liver disease Mother     fatty liver   . Hypertension Father   . COPD Father   . Hypertension Brother   . COPD Brother   . Heart disease Maternal Grandmother   . Diabetes Maternal Grandmother   . Heart murmur Son   . Other Son     oversized tonsils     Exam       Pelvic Exam:    Perineum: Normal Perineum   Vulva:  normal   Vagina:  normal mucosa, normal discharge, no palpable nodules   Uterus Normal, Gravid, FH: 30     Cervix: normal   Adnexa: Not palpable   Urinary:  urethral meatus normal    System:     Skin: normal color30ation and turgor, no rashes    Neurologic: oriented, normal, normal mood   Extremities: normal strength, tone, and muscle mass   HEENT PERRLA   Mouth/Teeth mucous membranes moist, normal dentition   Neck supple and no masses   Cardiovascular: regular rate and rhythm   Respiratory:  appears well, vitals normal, no respiratory distress, acyanotic   Abdomen: soft, non-tender;  FHR: 130          Assessment:    Pregnancy: [id number] Patient Active Problem List   Diagnosis Date Noted  . Supervision of normal pregnancy 09/25/2015  . Pica 09/25/2015  . Pregnant 09/05/2015  . Endometritis 02/27/2014  .  Scabies 12/12/2013  . 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 Prior fetal macrosomia, antepartum 09/18/2013  . ADHD   . Migraines         Plan:     Initial labs ordered--will draw later this week when pt comes in for PN2  Continue prenatal vitamins  Problem list reviewed and updated   Encouraged well-balanced diet Genetic Screening discussed : too late.  Ultrasound discussed; fetal survey: results reviewed.  Return in about 2 weeks (around 10/09/2015) for [name] [name].  CRESENZO-DISHMAN,Creasie Lacosse 09/25/2015

## 2015-09-26 LAB — URINE CULTURE: Organism ID, Bacteria: NO GROWTH

## 2015-09-27 LAB — CYTOLOGY - PAP

## 2015-09-28 LAB — PMP SCREEN PROFILE (10S), URINE
Amphetamine Screen, Ur: NEGATIVE ng/mL
BENZODIAZEPINE SCREEN, URINE: NEGATIVE ng/mL
Barbiturate Screen, Ur: NEGATIVE ng/mL
CANNABINOIDS UR QL SCN: NEGATIVE ng/mL
Cocaine(Metab.)Screen, Urine: NEGATIVE ng/mL
Creatinine(Crt), U: 105 mg/dL (ref 20.0–300.0)
METHADONE SCREEN, URINE: NEGATIVE ng/mL
Opiate Scrn, Ur: NEGATIVE ng/mL
Oxycodone+Oxymorphone Ur Ql Scn: NEGATIVE ng/mL
PCP Scrn, Ur: NEGATIVE ng/mL
Ph of Urine: 7.7 (ref 4.5–8.9)
Propoxyphene, Screen: NEGATIVE ng/mL

## 2015-09-28 LAB — CBC
Hematocrit: 34.3 % (ref 34.0–46.6)
Hemoglobin: 11.3 g/dL (ref 11.1–15.9)
MCH: 25.7 pg — ABNORMAL LOW (ref 26.6–33.0)
MCHC: 32.9 g/dL (ref 31.5–35.7)
MCV: 78 fL — ABNORMAL LOW (ref 79–97)
PLATELETS: 332 10*3/uL (ref 150–379)
RBC: 4.4 x10E6/uL (ref 3.77–5.28)
RDW: 15.5 % — ABNORMAL HIGH (ref 12.3–15.4)
WBC: 17.7 10*3/uL — AB (ref 3.4–10.8)

## 2015-09-28 LAB — URINALYSIS, ROUTINE W REFLEX MICROSCOPIC
Bilirubin, UA: NEGATIVE
GLUCOSE, UA: NEGATIVE
KETONES UA: NEGATIVE
LEUKOCYTES UA: NEGATIVE
NITRITE UA: NEGATIVE
Protein, UA: NEGATIVE
RBC, UA: NEGATIVE
SPEC GRAV UA: 1.023 (ref 1.005–1.030)
Urobilinogen, Ur: 0.2 mg/dL (ref 0.2–1.0)
pH, UA: 7.5 (ref 5.0–7.5)

## 2015-09-28 LAB — GLUCOSE TOLERANCE, 2 HOURS W/ 1HR
GLUCOSE, 1 HOUR: 115 mg/dL (ref 65–179)
GLUCOSE, FASTING: 74 mg/dL (ref 65–91)
Glucose, 2 hour: 109 mg/dL (ref 65–152)

## 2015-09-28 LAB — HEPATITIS B SURFACE ANTIGEN: HEP B S AG: NEGATIVE

## 2015-09-28 LAB — HIV ANTIBODY (ROUTINE TESTING W REFLEX): HIV SCREEN 4TH GENERATION: NONREACTIVE

## 2015-09-28 LAB — RUBELLA SCREEN: RUBELLA: 1.55 {index} (ref >0.99)

## 2015-09-28 LAB — RPR: RPR Ser Ql: NONREACTIVE

## 2015-09-28 LAB — ANTIBODY SCREEN: Antibody Screen: NEGATIVE

## 2015-10-02 ENCOUNTER — Encounter: Payer: Self-pay | Admitting: Advanced Practice Midwife

## 2015-10-02 DIAGNOSIS — IMO0002 Reserved for concepts with insufficient information to code with codable children: Secondary | ICD-10-CM | POA: Insufficient documentation

## 2015-10-09 ENCOUNTER — Ambulatory Visit (INDEPENDENT_AMBULATORY_CARE_PROVIDER_SITE_OTHER): Payer: Medicaid Other | Admitting: Advanced Practice Midwife

## 2015-10-09 ENCOUNTER — Encounter: Payer: Self-pay | Admitting: Advanced Practice Midwife

## 2015-10-09 VITALS — BP 120/70 | HR 76 | Wt 168.3 lb

## 2015-10-09 DIAGNOSIS — Z331 Pregnant state, incidental: Secondary | ICD-10-CM

## 2015-10-09 DIAGNOSIS — Z1389 Encounter for screening for other disorder: Secondary | ICD-10-CM

## 2015-10-09 DIAGNOSIS — Z3493 Encounter for supervision of normal pregnancy, unspecified, third trimester: Secondary | ICD-10-CM

## 2015-10-09 LAB — POCT URINALYSIS DIPSTICK
Blood, UA: NEGATIVE
Glucose, UA: NEGATIVE
KETONES UA: NEGATIVE
Nitrite, UA: NEGATIVE
Protein, UA: NEGATIVE

## 2015-10-09 NOTE — Progress Notes (Signed)
[id number] [redacted]w[redacted]d Estimated Date of Delivery: 12/04/15  Weight 168 lb 4.8 oz (76.34 kg), unknown if currently breastfeeding.   BP weight and urine results all reviewed and noted.  Please refer to the obstetrical flow sheet for the fundal height and fetal heart rate documentation:  Patient reports good fetal movement, denies any bleeding and no rupture of membranes symptoms or regular contractions. Patient is without complaints. All questions were answered.  Orders Placed This Encounter  Procedures  . POCT urinalysis dipstick    Plan:  Continued routine obstetrical care,   Return in about 2 weeks (around 10/23/2015) for LROB.

## 2015-10-17 ENCOUNTER — Encounter (HOSPITAL_COMMUNITY): Payer: Self-pay

## 2015-10-17 ENCOUNTER — Telehealth: Payer: Self-pay | Admitting: *Deleted

## 2015-10-17 ENCOUNTER — Inpatient Hospital Stay (HOSPITAL_COMMUNITY)
Admission: AD | Admit: 2015-10-17 | Discharge: 2015-10-17 | Disposition: A | Discharge status: Home / Self Care | Payer: Medicaid Other | Source: Ambulatory Visit | Attending: Obstetrics & Gynecology | Admitting: Obstetrics & Gynecology

## 2015-10-17 DIAGNOSIS — O99333 Smoking (tobacco) complicating pregnancy, third trimester: Secondary | ICD-10-CM | POA: Insufficient documentation

## 2015-10-17 DIAGNOSIS — K5900 Constipation, unspecified: Secondary | ICD-10-CM

## 2015-10-17 DIAGNOSIS — F5089 Other specified eating disorder: Secondary | ICD-10-CM

## 2015-10-17 DIAGNOSIS — O99613 Diseases of the digestive system complicating pregnancy, third trimester: Secondary | ICD-10-CM | POA: Diagnosis not present

## 2015-10-17 DIAGNOSIS — R102 Pelvic and perineal pain: Secondary | ICD-10-CM | POA: Diagnosis present

## 2015-10-17 DIAGNOSIS — Z3A33 33 weeks gestation of pregnancy: Secondary | ICD-10-CM | POA: Diagnosis not present

## 2015-10-17 DIAGNOSIS — O26893 Other specified pregnancy related conditions, third trimester: Secondary | ICD-10-CM | POA: Diagnosis not present

## 2015-10-17 DIAGNOSIS — Z3493 Encounter for supervision of normal pregnancy, unspecified, third trimester: Secondary | ICD-10-CM

## 2015-10-17 DIAGNOSIS — O99513 Diseases of the respiratory system complicating pregnancy, third trimester: Secondary | ICD-10-CM | POA: Insufficient documentation

## 2015-10-17 DIAGNOSIS — J45909 Unspecified asthma, uncomplicated: Secondary | ICD-10-CM | POA: Diagnosis not present

## 2015-10-17 DIAGNOSIS — IMO0002 Reserved for concepts with insufficient information to code with codable children: Secondary | ICD-10-CM

## 2015-10-17 DIAGNOSIS — F17210 Nicotine dependence, cigarettes, uncomplicated: Secondary | ICD-10-CM | POA: Diagnosis not present

## 2015-10-17 LAB — URINALYSIS, ROUTINE W REFLEX MICROSCOPIC
BILIRUBIN URINE: NEGATIVE
Glucose, UA: NEGATIVE mg/dL
HGB URINE DIPSTICK: NEGATIVE
Ketones, ur: 15 mg/dL — AB
Leukocytes, UA: NEGATIVE
Nitrite: NEGATIVE
PROTEIN: NEGATIVE mg/dL
Specific Gravity, Urine: >1.03 — ABNORMAL HIGH (ref 1.005–1.030)
pH: 6 (ref 5.0–8.0)

## 2015-10-17 NOTE — MAU Provider Note (Signed)
History     CSN: [id number]  Arrival date and time: 10/17/15 [id number]   First Provider Initiated Contact with Patient 10/17/15 2013      Chief Complaint  Patient presents with  . Vaginal Pain  . Vaginal Discharge   HPI  [name] [name] [id number] [redacted]w[redacted]d presents to the MAU stating that she feels like her "vag is going to split". States she cant remember the last time she had a bowel movement and sometimes she feels like she has to pee and cant. Denies vaginal bleeding, LOF, reports positive fetal movement.  Past Medical History  Diagnosis Date  . Asthma   . Migraines     with aura (see flashes)  . Pregnant 09/05/2015  . ADHD     Past Surgical History  Procedure Laterality Date  . Dilation and curettage of uterus    . Wisdom tooth extraction    . Jaw wired shut    . Penny removed from esophagus    . Colonoscopy with propofol N/A 12/24/2014    Procedure: COLONOSCOPY WITH PROPOFOL;  Surgeon: Rogene Houston, MD;  Location: [facility];  Service: Endoscopy;  Laterality: N/A;  total cecal time- 18 minutes  . Polypectomy N/A 12/24/2014    Procedure: POLYPECTOMY;  Surgeon: Rogene Houston, MD;  Location: [facility];  Service: Endoscopy;  Laterality: N/A;  . Tooth pulled      Family History  Problem Relation Age of Onset  . Diabetes Mother   . Liver disease Mother     fatty liver   . Hypertension Father   . COPD Father   . Hypertension Brother   . COPD Brother   . Heart disease Maternal Grandmother   . Diabetes Maternal Grandmother   . Heart murmur Son   . Other Son     oversized tonsils    Social History  Substance Use Topics  . Smoking status: Current Some Day Smoker -- 0.00 packs/day for 10 years    Types: Cigarettes    Last Attempt to Quit: 01/08/2012  . Smokeless tobacco: Never Used  . Alcohol Use: No     Comment: every now and then; not now    Allergies:  Allergies  Allergen Reactions  . Darvocet [Propoxyphene N-Acetaminophen] Other (See Comments)    Hallucinations -  Dislocated her jaw during a vivid episode  . Imitrex [Sumatriptan Base]     Patient states that imitrex gives her chest pain  . Adhesive [Tape] Hives  . Codeine Other (See Comments)    unknown  . Percocet [Oxycodone-Acetaminophen] Other (See Comments)    "made me feel funny"   . Latex Rash  . Sulfa Antibiotics Swelling, Rash and Other (See Comments)    Eye drops at child -- caused pain, swelling, & redness.  Her doctor said it was an allergy.    Does not remember name of drug but had, "face" sound in its name.    Prescriptions prior to admission  Medication Sig Dispense Refill Last Dose  . prenatal vitamin w/FE, FA (PRENATAL 1 + 1) 27-1 MG TABS tablet Take 1 tablet by mouth daily at 12 noon. 30 each 11 Taking    Review of Systems  Constitutional: Negative for fever.  Gastrointestinal: Positive for constipation.  Genitourinary:       Vaginal pain and pressure  All other systems reviewed and are negative.  Physical Exam   Blood pressure 116/51, pulse 110, temperature 98.3 F (36.8 C), temperature source Oral, resp. rate 18, height 5' (  1.524 m), weight 168 lb (76.204 kg), unknown if currently breastfeeding.  Physical Exam  Nursing note and vitals reviewed. Constitutional: She is oriented to person, place, and time. She appears well-developed and well-nourished. No distress.  HENT:  Head: Normocephalic and atraumatic.  Cardiovascular: Normal rate.   Respiratory: Effort normal. No respiratory distress.  GI: Soft. She exhibits no distension.  Genitourinary: Vagina normal.  Musculoskeletal: Normal range of motion.  Neurological: She is alert and oriented to person, place, and time.  Skin: Skin is warm and dry.  Psychiatric: She has a normal mood and affect. Her behavior is normal. Judgment and thought content normal.   Results for orders placed or performed during the hospital encounter of 10/17/15 (from the past 24 hour(s))  Urinalysis, Routine w reflex microscopic (not at  [facility])     Status: Abnormal   Collection Time: 10/17/15  7:48 PM  Result Value Ref Range   Color, Urine YELLOW YELLOW   APPearance CLEAR CLEAR   Specific Gravity, Urine >1.030 (H) 1.005 - 1.030   pH 6.0 5.0 - 8.0   Glucose, UA NEGATIVE NEGATIVE mg/dL   Hgb urine dipstick NEGATIVE NEGATIVE   Bilirubin Urine NEGATIVE NEGATIVE   Ketones, ur 15 (A) NEGATIVE mg/dL   Protein, ur NEGATIVE NEGATIVE mg/dL   Nitrite NEGATIVE NEGATIVE   Leukocytes, UA NEGATIVE NEGATIVE   Dilation: Closed Effacement (%): Thick Station:  (high) Exam by:: Darral Rishel,cnm  MAU Course  Procedures  MDM Pending urine. FHR category 1 no contractions. Advised miralax and coloace OTC and to drink plenty of water.  Assessment and Plan  Vaginal Pressure Constipation  Discharge   Axcel Horsch Grissett 10/17/2015, 8:19 PM

## 2015-10-17 NOTE — Discharge Instructions (Signed)

## 2015-10-17 NOTE — MAU Note (Signed)
Pt presents complaining of stomach pains that come around her stomach with vaginal and rectal pressure. Also having feels more "wet" and thinks she might be leaking. Reports good fetal movement. Denies bleeding.

## 2015-10-17 NOTE — Telephone Encounter (Signed)
Pt c/o vaginal and rectal pressure, +FM, no vaginal bleeding, stomach tightening, clear watery discharge. Pt advised to go to [facility] now for evaluation. Pt verbalized understanding.

## 2015-10-23 ENCOUNTER — Encounter: Payer: Medicaid Other | Admitting: Advanced Practice Midwife

## 2015-10-29 ENCOUNTER — Encounter: Payer: Self-pay | Admitting: Obstetrics & Gynecology

## 2015-10-29 ENCOUNTER — Ambulatory Visit (INDEPENDENT_AMBULATORY_CARE_PROVIDER_SITE_OTHER): Payer: Medicaid Other | Admitting: Obstetrics & Gynecology

## 2015-10-29 VITALS — BP 100/60 | HR 84 | Wt 172.0 lb

## 2015-10-29 DIAGNOSIS — Z331 Pregnant state, incidental: Secondary | ICD-10-CM

## 2015-10-29 DIAGNOSIS — Z3A35 35 weeks gestation of pregnancy: Secondary | ICD-10-CM

## 2015-10-29 DIAGNOSIS — Z1389 Encounter for screening for other disorder: Secondary | ICD-10-CM

## 2015-10-29 DIAGNOSIS — Z3493 Encounter for supervision of normal pregnancy, unspecified, third trimester: Secondary | ICD-10-CM

## 2015-10-29 DIAGNOSIS — Z3483 Encounter for supervision of other normal pregnancy, third trimester: Secondary | ICD-10-CM

## 2015-10-29 LAB — POCT URINALYSIS DIPSTICK
Glucose, UA: NEGATIVE
LEUKOCYTES UA: NEGATIVE
Nitrite, UA: NEGATIVE
PROTEIN UA: NEGATIVE
RBC UA: NEGATIVE

## 2015-10-29 NOTE — Progress Notes (Signed)
[id number] [redacted]w[redacted]d Estimated Date of Delivery: 12/04/15  Blood pressure 100/60, pulse 84, weight 172 lb (78.019 kg), unknown if currently breastfeeding.   BP weight and urine results all reviewed and noted.  Please refer to the obstetrical flow sheet for the fundal height and fetal heart rate documentation:  Patient reports good fetal movement, denies any bleeding and no rupture of membranes symptoms or regular contractions. Patient is without complaints. All questions were answered.  Orders Placed This Encounter  Procedures  . POCT urinalysis dipstick    Plan:  Continued routine obstetrical care, increased pelvic pressure, cervix LTC  Return in about 2 weeks (around 11/12/2015) for LROB.

## 2015-11-12 ENCOUNTER — Encounter: Payer: Medicaid Other | Admitting: Obstetrics & Gynecology

## 2015-11-12 ENCOUNTER — Encounter: Payer: Medicaid Other | Admitting: Women's Health

## 2015-11-13 ENCOUNTER — Ambulatory Visit (INDEPENDENT_AMBULATORY_CARE_PROVIDER_SITE_OTHER): Payer: Medicaid Other | Admitting: Advanced Practice Midwife

## 2015-11-13 VITALS — BP 100/60 | HR 98 | Wt 177.0 lb

## 2015-11-13 DIAGNOSIS — Z3493 Encounter for supervision of normal pregnancy, unspecified, third trimester: Secondary | ICD-10-CM

## 2015-11-13 DIAGNOSIS — Z331 Pregnant state, incidental: Secondary | ICD-10-CM

## 2015-11-13 DIAGNOSIS — Z3483 Encounter for supervision of other normal pregnancy, third trimester: Secondary | ICD-10-CM

## 2015-11-13 DIAGNOSIS — Z369 Encounter for antenatal screening, unspecified: Secondary | ICD-10-CM

## 2015-11-13 DIAGNOSIS — Z3685 Encounter for antenatal screening for Streptococcus B: Secondary | ICD-10-CM

## 2015-11-13 DIAGNOSIS — Z1389 Encounter for screening for other disorder: Secondary | ICD-10-CM

## 2015-11-13 DIAGNOSIS — Z3A37 37 weeks gestation of pregnancy: Secondary | ICD-10-CM

## 2015-11-13 LAB — POCT URINALYSIS DIPSTICK
GLUCOSE UA: NEGATIVE
Ketones, UA: NEGATIVE
Leukocytes, UA: NEGATIVE
Nitrite, UA: NEGATIVE
RBC UA: NEGATIVE

## 2015-11-13 NOTE — Progress Notes (Signed)
[id number] [redacted]w[redacted]d Estimated Date of Delivery: 12/04/15  Blood pressure 100/60, pulse 98, weight 177 lb (80.287 kg), unknown if currently breastfeeding.   BP weight and urine results all reviewed and noted.  Please refer to the obstetrical flow sheet for the fundal height and fetal heart rate documentation:  Patient reports good fetal movement, denies any bleeding or regular contractions. Patient s/o watery discharge for a week.  SSE:  No pooling , fern and valsalva negative.  + yeast appearing DC.  All questions were answered.  Orders Placed This Encounter  Procedures  . GC/Chlamydia Probe Amp  . Strep Gp B NAA  . POCT urinalysis dipstick    Plan:  Continued routine obstetrical care,  OTC yeast meds  Return in about 1 week (around 11/20/2015).

## 2015-11-15 LAB — OB RESULTS CONSOLE GBS: GBS: NEGATIVE

## 2015-11-16 LAB — STREP GP B NAA: Strep Gp B NAA: NEGATIVE

## 2015-11-16 LAB — GC/CHLAMYDIA PROBE AMP
CHLAMYDIA, DNA PROBE: NEGATIVE
NEISSERIA GONORRHOEAE BY PCR: NEGATIVE

## 2015-11-20 ENCOUNTER — Ambulatory Visit (INDEPENDENT_AMBULATORY_CARE_PROVIDER_SITE_OTHER): Payer: Medicaid Other | Admitting: Advanced Practice Midwife

## 2015-11-20 VITALS — BP 98/58 | HR 84 | Wt 177.0 lb

## 2015-11-20 DIAGNOSIS — Z3493 Encounter for supervision of normal pregnancy, unspecified, third trimester: Secondary | ICD-10-CM

## 2015-11-20 DIAGNOSIS — Z1389 Encounter for screening for other disorder: Secondary | ICD-10-CM

## 2015-11-20 DIAGNOSIS — Z331 Pregnant state, incidental: Secondary | ICD-10-CM

## 2015-11-20 LAB — POCT URINALYSIS DIPSTICK
Glucose, UA: NEGATIVE
Glucose, UA: NEGATIVE
Ketones, UA: NEGATIVE
Leukocytes, UA: NEGATIVE
NITRITE UA: NEGATIVE
PROTEIN UA: NEGATIVE

## 2015-11-20 NOTE — Progress Notes (Signed)
[id number] [redacted]w[redacted]d Estimated Date of Delivery: 12/04/15  Blood pressure 98/58, pulse 84, weight 177 lb (80.287 kg), unknown if currently breastfeeding.   BP weight and urine results all reviewed and noted.  Please refer to the obstetrical flow sheet for the fundal height and fetal heart rate documentation:  Patient reports good fetal movement, denies any bleeding and no rupture of membranes symptoms or regular contractions. Patient is without complaints. All questions were answered.  Orders Placed This Encounter  Procedures  . POCT urinalysis dipstick    Plan:  Continued routine obstetrical care,   Return in about 1 week (around 11/27/2015) for LROB.

## 2015-11-27 ENCOUNTER — Ambulatory Visit (INDEPENDENT_AMBULATORY_CARE_PROVIDER_SITE_OTHER): Payer: Medicaid Other | Admitting: Advanced Practice Midwife

## 2015-11-27 VITALS — BP 108/48 | HR 100 | Wt 178.0 lb

## 2015-11-27 DIAGNOSIS — Z3493 Encounter for supervision of normal pregnancy, unspecified, third trimester: Secondary | ICD-10-CM

## 2015-11-27 DIAGNOSIS — Z331 Pregnant state, incidental: Secondary | ICD-10-CM

## 2015-11-27 DIAGNOSIS — Z1389 Encounter for screening for other disorder: Secondary | ICD-10-CM

## 2015-11-27 LAB — POCT URINALYSIS DIPSTICK
GLUCOSE UA: NEGATIVE
KETONES UA: NEGATIVE
Leukocytes, UA: NEGATIVE
Nitrite, UA: NEGATIVE

## 2015-11-27 NOTE — Progress Notes (Signed)
[id number] [redacted]w[redacted]d Estimated Date of Delivery: 12/04/15  Blood pressure 108/48, pulse 100, weight 178 lb (80.74 kg), unknown if currently breastfeeding.   BP weight and urine results all reviewed and noted.  Please refer to the obstetrical flow sheet for the fundal height and fetal heart rate documentation:  Patient reports good fetal movement, denies any bleeding and no rupture of membranes symptoms or regular contractions. Patient is without complaints. All questions were answered.  Orders Placed This Encounter  Procedures  . POCT urinalysis dipstick    Plan:  Continued routine obstetrical care, membranes stripped  Return in about 1 week (around 12/04/2015) for LROB.

## 2015-11-27 NOTE — Patient Instructions (Signed)

## 2015-11-30 ENCOUNTER — Inpatient Hospital Stay (HOSPITAL_COMMUNITY)
Admission: AD | Admit: 2015-11-30 | Discharge: 2015-11-30 | Disposition: A | Discharge status: Home / Self Care | Payer: Medicaid Other | Source: Ambulatory Visit | Attending: Obstetrics & Gynecology | Admitting: Obstetrics & Gynecology

## 2015-11-30 DIAGNOSIS — O09299 Supervision of pregnancy with other poor reproductive or obstetric history, unspecified trimester: Secondary | ICD-10-CM

## 2015-11-30 DIAGNOSIS — Z3493 Encounter for supervision of normal pregnancy, unspecified, third trimester: Secondary | ICD-10-CM

## 2015-11-30 DIAGNOSIS — IMO0002 Reserved for concepts with insufficient information to code with codable children: Secondary | ICD-10-CM | POA: Diagnosis present

## 2015-11-30 DIAGNOSIS — R768 Other specified abnormal immunological findings in serum: Secondary | ICD-10-CM | POA: Diagnosis present

## 2015-11-30 DIAGNOSIS — Z3490 Encounter for supervision of normal pregnancy, unspecified, unspecified trimester: Secondary | ICD-10-CM

## 2015-11-30 DIAGNOSIS — F5089 Other specified eating disorder: Secondary | ICD-10-CM

## 2015-11-30 LAB — POCT FERN TEST: POCT Fern Test: NEGATIVE

## 2015-11-30 LAB — AMNISURE RUPTURE OF MEMBRANE (ROM) NOT AT ARMC: AMNISURE: NEGATIVE

## 2015-11-30 MED ORDER — CYCLOBENZAPRINE HCL 5 MG PO TABS
5.0000 mg | ORAL_TABLET | Freq: Once | ORAL | Status: AC
  Administered 2015-11-30: 5 mg via ORAL
  Filled 2015-11-30: qty 1

## 2015-11-30 NOTE — Discharge Instructions (Signed)
Third Trimester of Pregnancy The third trimester is from week 29 through week 42, months 7 through 9. This trimester is when your unborn baby (fetus) is growing very fast. At the end of the ninth month, the unborn baby is about 20 inches in length. It weighs about 6-10 pounds.  HOME CARE   Avoid all smoking, herbs, and alcohol. Avoid drugs not approved by your doctor.  Do not use any tobacco products, including cigarettes, chewing tobacco, and electronic cigarettes. If you need help quitting, ask your doctor. You may get counseling or other support to help you quit.  Only take medicine as told by your doctor. Some medicines are safe and some are not during pregnancy.  Exercise only as told by your doctor. Stop exercising if you start having cramps.  Eat regular, healthy meals.  Wear a good support bra if your breasts are tender.  Do not use hot tubs, steam rooms, or saunas.  Wear your seat belt when driving.  Avoid raw meat, uncooked cheese, and liter boxes and soil used by cats.  Take your prenatal vitamins.  Take 1500-2000 milligrams of calcium daily starting at the 20th week of pregnancy until you deliver your baby.  Try taking medicine that helps you poop (stool softener) as needed, and if your doctor approves. Eat more fiber by eating fresh fruit, vegetables, and whole grains. Drink enough fluids to keep your pee (urine) clear or pale yellow.  Take warm water baths (sitz baths) to soothe pain or discomfort caused by hemorrhoids. Use hemorrhoid cream if your doctor approves.  If you have puffy, bulging veins (varicose veins), wear support hose. Raise (elevate) your feet for 15 minutes, 3-4 times a day. Limit salt in your diet.  Avoid heavy lifting, wear low heels, and sit up straight.  Rest with your legs raised if you have leg cramps or low back pain.  Visit your dentist if you have not gone during your pregnancy. Use a soft toothbrush to brush your teeth. Be gentle when you  floss.  You can have sex (intercourse) unless your doctor tells you not to.  Do not travel far distances unless you must. Only do so with your doctor's approval.  Take prenatal classes.  Practice driving to the hospital.  Pack your hospital bag.  Prepare the baby's room.  Go to your doctor visits. GET HELP IF:  You are not sure if you are in labor or if your water has broken.  You are dizzy.  You have mild cramps or pressure in your lower belly (abdominal).  You have a nagging pain in your belly area.  You continue to feel sick to your stomach (nauseous), throw up (vomit), or have watery poop (diarrhea).  You have bad smelling fluid coming from your vagina.  You have pain with peeing (urination). GET HELP RIGHT AWAY IF:   You have a fever.  You are leaking fluid from your vagina.  You are spotting or bleeding from your vagina.  You have severe belly cramping or pain.  You lose or gain weight rapidly.  You have trouble catching your breath and have chest pain.  You notice sudden or extreme puffiness (swelling) of your face, hands, ankles, feet, or legs.  You have not felt the baby move in over an hour.  You have severe headaches that do not go away with medicine.  You have vision changes.   This information is not intended to replace advice given to you by your health care  provider. Make sure you discuss any questions you have with your health care provider.   Document Released: 09/30/2009 Document Revised: 07/27/2014 Document Reviewed: 09/06/2012 Elsevier Interactive Patient Education 2016 [place]. Fetal Movement Counts Patient Name: __________________________________________________ Patient Due Date: ____________________ Performing a fetal movement count is highly recommended in high-risk pregnancies, but it is good for every pregnant woman to do. Your health care provider may ask you to start counting fetal movements at 28 weeks of the pregnancy.  Fetal movements often increase:  After eating a full meal.  After physical activity.  After eating or drinking something sweet or cold.  At rest. Pay attention to when you feel the baby is most active. This will help you notice a pattern of your baby's sleep and wake cycles and what factors contribute to an increase in fetal movement. It is important to perform a fetal movement count at the same time each day when your baby is normally most active.  HOW TO COUNT FETAL MOVEMENTS  Find a quiet and comfortable area to sit or lie down on your left side. Lying on your left side provides the best blood and oxygen circulation to your baby.  Write down the day and time on a sheet of paper or in a journal.  Start counting kicks, flutters, swishes, rolls, or jabs in a 2-hour period. You should feel at least 10 movements within 2 hours.  If you do not feel 10 movements in 2 hours, wait 2-3 hours and count again. Look for a change in the pattern or not enough counts in 2 hours. SEEK MEDICAL CARE IF:  You feel less than 10 counts in 2 hours, tried twice.  There is no movement in over an hour.  The pattern is changing or taking longer each day to reach 10 counts in 2 hours.  You feel the baby is not moving as he or she usually does. Date: ____________ Movements: ____________ Start time: ____________ Elizebeth Koller time: ____________  Date: ____________ Movements: ____________ Start time: ____________ Elizebeth Koller time: ____________ Date: ____________ Movements: ____________ Start time: ____________ Elizebeth Koller time: ____________ Date: ____________ Movements: ____________ Start time: ____________ Elizebeth Koller time: ____________ Date: ____________ Movements: ____________ Start time: ____________ Elizebeth Koller time: ____________ Date: ____________ Movements: ____________ Start time: ____________ Elizebeth Koller time: ____________ Date: ____________ Movements: ____________ Start time: ____________ Elizebeth Koller time: ____________ Date:  ____________ Movements: ____________ Start time: ____________ Elizebeth Koller time: ____________  Date: ____________ Movements: ____________ Start time: ____________ Elizebeth Koller time: ____________ Date: ____________ Movements: ____________ Start time: ____________ Elizebeth Koller time: ____________ Date: ____________ Movements: ____________ Start time: ____________ Elizebeth Koller time: ____________ Date: ____________ Movements: ____________ Start time: ____________ Elizebeth Koller time: ____________ Date: ____________ Movements: ____________ Start time: ____________ Elizebeth Koller time: ____________ Date: ____________ Movements: ____________ Start time: ____________ Elizebeth Koller time: ____________ Date: ____________ Movements: ____________ Start time: ____________ Elizebeth Koller time: ____________  Date: ____________ Movements: ____________ Start time: ____________ Elizebeth Koller time: ____________ Date: ____________ Movements: ____________ Start time: ____________ Elizebeth Koller time: ____________ Date: ____________ Movements: ____________ Start time: ____________ Elizebeth Koller time: ____________ Date: ____________ Movements: ____________ Start time: ____________ Elizebeth Koller time: ____________ Date: ____________ Movements: ____________ Start time: ____________ Elizebeth Koller time: ____________ Date: ____________ Movements: ____________ Start time: ____________ Elizebeth Koller time: ____________ Date: ____________ Movements: ____________ Start time: ____________ Elizebeth Koller time: ____________  Date: ____________ Movements: ____________ Start time: ____________ Elizebeth Koller time: ____________ Date: ____________ Movements: ____________ Start time: ____________ Elizebeth Koller time: ____________ Date: ____________ Movements: ____________ Start time: ____________ Elizebeth Koller time: ____________ Date: ____________ Movements: ____________ Start time: ____________ Elizebeth Koller time: ____________ Date: ____________ Movements: ____________ Start time: ____________  Finish time: ____________ Date: ____________ Movements: ____________ Start  time: ____________ Elizebeth Koller time: ____________ Date: ____________ Movements: ____________ Start time: ____________ Elizebeth Koller time: ____________  Date: ____________ Movements: ____________ Start time: ____________ Elizebeth Koller time: ____________ Date: ____________ Movements: ____________ Start time: ____________ Elizebeth Koller time: ____________ Date: ____________ Movements: ____________ Start time: ____________ Elizebeth Koller time: ____________ Date: ____________ Movements: ____________ Start time: ____________ Elizebeth Koller time: ____________ Date: ____________ Movements: ____________ Start time: ____________ Elizebeth Koller time: ____________ Date: ____________ Movements: ____________ Start time: ____________ Elizebeth Koller time: ____________ Date: ____________ Movements: ____________ Start time: ____________ Elizebeth Koller time: ____________  Date: ____________ Movements: ____________ Start time: ____________ Elizebeth Koller time: ____________ Date: ____________ Movements: ____________ Start time: ____________ Elizebeth Koller time: ____________ Date: ____________ Movements: ____________ Start time: ____________ Elizebeth Koller time: ____________ Date: ____________ Movements: ____________ Start time: ____________ Elizebeth Koller time: ____________ Date: ____________ Movements: ____________ Start time: ____________ Elizebeth Koller time: ____________ Date: ____________ Movements: ____________ Start time: ____________ Elizebeth Koller time: ____________ Date: ____________ Movements: ____________ Start time: ____________ Elizebeth Koller time: ____________  Date: ____________ Movements: ____________ Start time: ____________ Elizebeth Koller time: ____________ Date: ____________ Movements: ____________ Start time: ____________ Elizebeth Koller time: ____________ Date: ____________ Movements: ____________ Start time: ____________ Elizebeth Koller time: ____________ Date: ____________ Movements: ____________ Start time: ____________ Elizebeth Koller time: ____________ Date: ____________ Movements: ____________ Start time: ____________ Elizebeth Koller time:  ____________ Date: ____________ Movements: ____________ Start time: ____________ Elizebeth Koller time: ____________ Date: ____________ Movements: ____________ Start time: ____________ Elizebeth Koller time: ____________  Date: ____________ Movements: ____________ Start time: ____________ Elizebeth Koller time: ____________ Date: ____________ Movements: ____________ Start time: ____________ Elizebeth Koller time: ____________ Date: ____________ Movements: ____________ Start time: ____________ Elizebeth Koller time: ____________ Date: ____________ Movements: ____________ Start time: ____________ Elizebeth Koller time: ____________ Date: ____________ Movements: ____________ Start time: ____________ Elizebeth Koller time: ____________ Date: ____________ Movements: ____________ Start time: ____________ Elizebeth Koller time: ____________   This information is not intended to replace advice given to you by your health care provider. Make sure you discuss any questions you have with your health care provider.   Document Released: 08/05/2006 Document Revised: 07/27/2014 Document Reviewed: 05/02/2012 Elsevier Interactive Patient Education 2016 [employer] of the uterus can occur throughout pregnancy. Contractions are not always a sign that you are in labor.  WHAT ARE BRAXTON HICKS CONTRACTIONS?  Contractions that occur before labor are called Braxton Hicks contractions, or false labor. Toward the end of pregnancy (32-34 weeks), these contractions can develop more often and may become more forceful. This is not true labor because these contractions do not result in opening (dilatation) and thinning of the cervix. They are sometimes difficult to tell apart from true labor because these contractions can be forceful and people have different pain tolerances. You should not feel embarrassed if you go to the hospital with false labor. Sometimes, the only way to tell if you are in true labor is for your health care provider to look for changes in the  cervix. If there are no prenatal problems or other health problems associated with the pregnancy, it is completely safe to be sent home with false labor and await the onset of true labor. HOW CAN YOU TELL THE DIFFERENCE BETWEEN TRUE AND FALSE LABOR? False Labor  The contractions of false labor are usually shorter and not as hard as those of true labor.   The contractions are usually irregular.   The contractions are often felt in the front of the lower abdomen and in the groin.   The contractions may go away when you walk around or change positions while lying down.   The contractions get weaker  and are shorter lasting as time goes on.   The contractions do not usually become progressively stronger, regular, and closer together as with true labor.  True Labor  Contractions in true labor last 30-70 seconds, become very regular, usually become more intense, and increase in frequency.   The contractions do not go away with walking.   The discomfort is usually felt in the top of the uterus and spreads to the lower abdomen and low back.   True labor can be determined by your health care provider with an exam. This will show that the cervix is dilating and getting thinner.  WHAT TO REMEMBER  Keep up with your usual exercises and follow other instructions given by your health care provider.   Take medicines as directed by your health care provider.   Keep your regular prenatal appointments.   Eat and drink lightly if you think you are going into labor.   If Braxton Hicks contractions are making you uncomfortable:   Change your position from lying down or resting to walking, or from walking to resting.   Sit and rest in a tub of warm water.   Drink 2-3 glasses of water. Dehydration may cause these contractions.   Do slow and deep breathing several times an hour.  WHEN SHOULD I SEEK IMMEDIATE MEDICAL CARE? Seek immediate medical care if:  Your contractions  become stronger, more regular, and closer together.   You have fluid leaking or gushing from your vagina.   You have a fever.   You pass blood-tinged mucus.   You have vaginal bleeding.   You have continuous abdominal pain.   You have low back pain that you never had before.   You feel your baby's head pushing down and causing pelvic pressure.   Your baby is not moving as much as it used to.    This information is not intended to replace advice given to you by your health care provider. Make sure you discuss any questions you have with your health care provider.   Document Released: 07/06/2005 Document Revised: 07/11/2013 Document Reviewed: 04/17/2013 Elsevier Interactive Patient Education [employer].

## 2015-11-30 NOTE — MAU Note (Signed)
Having a lot of pelvic pressure and pain. Feel like [place] is on my back. Felt some wetness on panties about 2300. Unsure if urine or not.

## 2015-12-04 ENCOUNTER — Telehealth (HOSPITAL_COMMUNITY): Payer: Self-pay | Admitting: *Deleted

## 2015-12-04 ENCOUNTER — Ambulatory Visit (INDEPENDENT_AMBULATORY_CARE_PROVIDER_SITE_OTHER): Payer: Medicaid Other | Admitting: Advanced Practice Midwife

## 2015-12-04 VITALS — BP 110/50 | HR 98 | Wt 181.0 lb

## 2015-12-04 DIAGNOSIS — Z3A40 40 weeks gestation of pregnancy: Secondary | ICD-10-CM

## 2015-12-04 DIAGNOSIS — Z3483 Encounter for supervision of other normal pregnancy, third trimester: Secondary | ICD-10-CM

## 2015-12-04 DIAGNOSIS — Z1389 Encounter for screening for other disorder: Secondary | ICD-10-CM

## 2015-12-04 DIAGNOSIS — Z3493 Encounter for supervision of normal pregnancy, unspecified, third trimester: Secondary | ICD-10-CM

## 2015-12-04 DIAGNOSIS — O09293 Supervision of pregnancy with other poor reproductive or obstetric history, third trimester: Secondary | ICD-10-CM

## 2015-12-04 DIAGNOSIS — Z331 Pregnant state, incidental: Secondary | ICD-10-CM

## 2015-12-04 LAB — POCT URINALYSIS DIPSTICK
GLUCOSE UA: NEGATIVE
KETONES UA: NEGATIVE
Leukocytes, UA: NEGATIVE
NITRITE UA: NEGATIVE
Protein, UA: NEGATIVE
RBC UA: NEGATIVE

## 2015-12-04 NOTE — Patient Instructions (Addendum)
If you are still pregnant on 5/24 at 7:30 am, come to [facility] ([address] in [place], [place]) to start your induction!  Eat a light meal before you come.  Emmit Alexanders!!  Back Pain in Pregnancy Back pain during pregnancy is common. It happens in about half of all pregnancies. It is important for you and your baby that you remain active during your pregnancy.If you feel that back pain is not allowing you to remain active or sleep well, it is time to see your caregiver. Back pain may be caused by several factors related to changes during your pregnancy.Fortunately, unless you had trouble with your back before your pregnancy, the pain is likely to get better after you deliver. Low back pain usually occurs between the fifth and seventh months of pregnancy. It can, however, happen in the first couple months. Factors that increase the risk of back problems include:   Previous back problems.  Injury to your back.  Having twins or multiple births.  A chronic cough.  Stress.  Job-related repetitive motions.  Muscle or spinal disease in the back.  Family history of back problems, ruptured (herniated) discs, or osteoporosis.  Depression, anxiety, and panic attacks. CAUSES   When you are pregnant, your body produces a hormone called relaxin. This hormonemakes the ligaments connecting the low back and pubic bones more flexible. This flexibility allows the baby to be delivered more easily. When your ligaments are loose, your muscles need to work harder to support your back. Soreness in your back can come from tired muscles. Soreness can also come from back tissues that are irritated since they are receiving less support.  As the baby grows, it puts pressure on the nerves and blood vessels in your pelvis. This can cause back pain.  As the baby grows and gets heavier during pregnancy, the uterus pushes the stomach muscles forward and changes your center of gravity. This makes  your back muscles work harder to maintain good posture. SYMPTOMS  Lumbar pain during pregnancy Lumbar pain during pregnancy usually occurs at or above the waist in the center of the back. There may be pain and numbness that radiates into your leg or foot. This is similar to low back pain experienced by non-pregnant women. It usually increases with sitting for long periods of time, standing, or repetitive lifting. Tenderness may also be present in the muscles along your upper back. Posterior pelvic pain during pregnancy Pain in the back of the pelvis is more common than lumbar pain in pregnancy. It is a deep pain felt in your side at the waistline, or across the tailbone (sacrum), or in both places. You may have pain on one or both sides. This pain can also go into the buttocks and backs of the upper thighs. Pubic and groin pain may also be present. The pain does not quickly resolve with rest, and morning stiffness may also be present. Pelvic pain during pregnancy can be brought on by most activities. A high level of fitness before and during pregnancy may or may not prevent this problem. Labor pain is usually 1 to 2 minutes apart, lasts for about 1 minute, and involves a bearing down feeling or pressure in your pelvis. However, if you are at term with the pregnancy, constant low back pain can be the beginning of early labor, and you should be aware of this. DIAGNOSIS  X-rays of the back should not be done during the first 12 to 14 weeks of  the pregnancy and only when absolutely necessary during the rest of the pregnancy. MRIs do not give off radiation and are safe during pregnancy. MRIs also should only be done when absolutely necessary. HOME CARE INSTRUCTIONS  Exercise as directed by your caregiver. Exercise is the most effective way to prevent or manage back pain. If you have a back problem, it is especially important to avoid sports that require sudden body movements. Swimming and walking are great  activities.  Do not stand in one place for long periods of time.  Do not wear high heels.  Sit in chairs with good posture. Use a pillow on your lower back if necessary. Make sure your head rests over your shoulders and is not hanging forward.  Try sleeping on your side, preferably the left side, with a pillow or two between your legs. If you are sore after a night's rest, your bedmay betoo soft.Try placing a board between your mattress and box spring.  Listen to your body when lifting.If you are experiencing pain, ask for help or try bending yourknees more so you can use your leg muscles rather than your back muscles. Squat down when picking up something from the floor. Do not bend over.  Eat a healthy diet. Try to gain weight within your caregiver's recommendations.  Use heat or cold packs 3 to 4 times a day for 15 minutes to help with the pain.  Only take over-the-counter or prescription medicines for pain, discomfort, or fever as directed by your caregiver. Sudden (acute) back pain  Use bed rest for only the most extreme, acute episodes of back pain. Prolonged bed rest over 48 hours will aggravate your condition.  Ice is very effective for acute conditions.  Put ice in a plastic bag.  Place a towel between your skin and the bag.  Leave the ice on for 10 to 20 minutes every 2 hours, or as needed.  Using heat packs for 30 minutes prior to activities is also helpful. Continued back pain See your caregiver if you have continued problems. Your caregiver can help or refer you for appropriate physical therapy. With conditioning, most back problems can be avoided. Sometimes, a more serious issue may be the cause of back pain. You should be seen right away if new problems seem to be developing. Your caregiver may recommend:  A maternity girdle.  An elastic sling.  A back brace.  A massage therapist or acupuncture. SEEK MEDICAL CARE IF:   You are not able to do most of your  daily activities, even when taking the pain medicine you were given.  You need a referral to a physical therapist or chiropractor.  You want to try acupuncture. SEEK IMMEDIATE MEDICAL CARE IF:  You develop numbness, tingling, weakness, or problems with the use of your arms or legs.  You develop severe back pain that is no longer relieved with medicines.  You have a sudden change in bowel or bladder control.  You have increasing pain in other areas of the body.  You develop shortness of breath, dizziness, or fainting.  You develop nausea, vomiting, or sweating.  You have back pain which is similar to labor pains.  You have back pain along with your water breaking or vaginal bleeding.  You have back pain or numbness that travels down your leg.  Your back pain developed after you fell.  You develop pain on one side of your back. You may have a kidney stone.  You see blood in  your urine. You may have a bladder infection or kidney stone.  You have back pain with blisters. You may have shingles. Back pain is fairly common during pregnancy but should not be accepted as just part of the process. Back pain should always be treated as soon as possible. This will make your pregnancy as pleasant as possible.   This information is not intended to replace advice given to you by your health care provider. Make sure you discuss any questions you have with your health care provider.   Document Released: 10/14/2005 Document Revised: 09/28/2011 Document Reviewed: 11/25/2010 Elsevier Interactive Patient Education [employer].

## 2015-12-04 NOTE — Telephone Encounter (Signed)
Preadmission screen  

## 2015-12-04 NOTE — Progress Notes (Addendum)
[id number] [redacted]w[redacted]d Estimated Date of Delivery: 12/04/15  Blood pressure 110/50, pulse 98, weight 181 lb (82.101 kg), unknown if currently breastfeeding.   BP weight and urine results all reviewed and noted.  Please refer to the obstetrical flow sheet for the fundal height and fetal heart rate documentation:  Patient reports good fetal movement, denies any bleeding and no rupture of membranes symptoms or regular contractions. Patient is without complaints other than normal pregnancy complaints.Membranes swept again.  All questions were answered.  Orders Placed This Encounter  Procedures  . POCT urinalysis dipstick    Plan:  Continued routine obstetrical care, IOL scheduled for 5/24 at 0730 if undelivered  Return in about 6 weeks (around 01/15/2016) for postpartum check up; 6 weeks + 1 day for pre op for BTL with MD of choice.

## 2015-12-09 ENCOUNTER — Encounter (HOSPITAL_COMMUNITY): Payer: Self-pay

## 2015-12-09 ENCOUNTER — Inpatient Hospital Stay (HOSPITAL_COMMUNITY): Payer: Medicaid Other | Admitting: Anesthesiology

## 2015-12-09 ENCOUNTER — Inpatient Hospital Stay (HOSPITAL_COMMUNITY)
Admission: AD | Admit: 2015-12-09 | Discharge: 2015-12-11 | DRG: 774 | Disposition: A | Discharge status: Home / Self Care | Payer: Medicaid Other | Source: Ambulatory Visit | Attending: Family Medicine | Admitting: Family Medicine

## 2015-12-09 DIAGNOSIS — F17210 Nicotine dependence, cigarettes, uncomplicated: Secondary | ICD-10-CM | POA: Diagnosis present

## 2015-12-09 DIAGNOSIS — Z3A40 40 weeks gestation of pregnancy: Secondary | ICD-10-CM

## 2015-12-09 DIAGNOSIS — O9952 Diseases of the respiratory system complicating childbirth: Secondary | ICD-10-CM | POA: Diagnosis present

## 2015-12-09 DIAGNOSIS — Z825 Family history of asthma and other chronic lower respiratory diseases: Secondary | ICD-10-CM | POA: Diagnosis not present

## 2015-12-09 DIAGNOSIS — Z833 Family history of diabetes mellitus: Secondary | ICD-10-CM

## 2015-12-09 DIAGNOSIS — O9832 Other infections with a predominantly sexual mode of transmission complicating childbirth: Secondary | ICD-10-CM | POA: Diagnosis present

## 2015-12-09 DIAGNOSIS — A6000 Herpesviral infection of urogenital system, unspecified: Secondary | ICD-10-CM | POA: Diagnosis present

## 2015-12-09 DIAGNOSIS — O99334 Smoking (tobacco) complicating childbirth: Secondary | ICD-10-CM | POA: Diagnosis present

## 2015-12-09 DIAGNOSIS — Z8249 Family history of ischemic heart disease and other diseases of the circulatory system: Secondary | ICD-10-CM | POA: Diagnosis not present

## 2015-12-09 DIAGNOSIS — G43909 Migraine, unspecified, not intractable, without status migrainosus: Secondary | ICD-10-CM | POA: Diagnosis present

## 2015-12-09 DIAGNOSIS — O480 Post-term pregnancy: Secondary | ICD-10-CM | POA: Diagnosis present

## 2015-12-09 DIAGNOSIS — IMO0001 Reserved for inherently not codable concepts without codable children: Secondary | ICD-10-CM

## 2015-12-09 DIAGNOSIS — J45909 Unspecified asthma, uncomplicated: Secondary | ICD-10-CM | POA: Diagnosis present

## 2015-12-09 DIAGNOSIS — O99344 Other mental disorders complicating childbirth: Secondary | ICD-10-CM | POA: Diagnosis present

## 2015-12-09 DIAGNOSIS — F909 Attention-deficit hyperactivity disorder, unspecified type: Secondary | ICD-10-CM | POA: Diagnosis present

## 2015-12-09 LAB — TYPE AND SCREEN
ABO/RH(D): O POS
Antibody Screen: NEGATIVE

## 2015-12-09 LAB — CBC
HEMATOCRIT: 29.5 % — AB (ref 36.0–46.0)
HEMOGLOBIN: 9.3 g/dL — AB (ref 12.0–15.0)
MCH: 23.1 pg — AB (ref 26.0–34.0)
MCHC: 31.5 g/dL (ref 30.0–36.0)
MCV: 73.2 fL — AB (ref 78.0–100.0)
Platelets: 396 10*3/uL (ref 150–400)
RBC: 4.03 MIL/uL (ref 3.87–5.11)
RDW: 16.4 % — ABNORMAL HIGH (ref 11.5–15.5)
WBC: 28.4 10*3/uL — ABNORMAL HIGH (ref 4.0–10.5)

## 2015-12-09 MED ORDER — COCONUT OIL OIL: 1.0000 "application " | TOPICAL_OIL | Status: DC | PRN

## 2015-12-09 MED ORDER — TETANUS-DIPHTH-ACELL PERTUSSIS 5-2.5-18.5 LF-MCG/0.5 IM SUSP: 0.5000 mL | Freq: Once | INTRAMUSCULAR | Status: DC

## 2015-12-09 MED ORDER — LACTATED RINGERS IV SOLN: 500.0000 mL | INTRAVENOUS | Status: DC | PRN

## 2015-12-09 MED ORDER — TERBUTALINE SULFATE 1 MG/ML IJ SOLN
0.2500 mg | Freq: Once | INTRAMUSCULAR | Status: DC | PRN
  Filled 2015-12-09: qty 1

## 2015-12-09 MED ORDER — ZOLPIDEM TARTRATE 5 MG PO TABS: 5.0000 mg | ORAL_TABLET | Freq: Every evening | ORAL | Status: DC | PRN

## 2015-12-09 MED ORDER — WITCH HAZEL-GLYCERIN EX PADS: 1.0000 "application " | MEDICATED_PAD | CUTANEOUS | Status: DC | PRN

## 2015-12-09 MED ORDER — EPHEDRINE 5 MG/ML INJ
10.0000 mg | INTRAVENOUS | Status: DC | PRN
  Filled 2015-12-09: qty 2

## 2015-12-09 MED ORDER — DIPHENHYDRAMINE HCL 50 MG/ML IJ SOLN: 12.5000 mg | INTRAMUSCULAR | Status: DC | PRN

## 2015-12-09 MED ORDER — FERROUS SULFATE 325 (65 FE) MG PO TABS
325.0000 mg | ORAL_TABLET | Freq: Two times a day (BID) | ORAL | Status: DC
  Administered 2015-12-10 – 2015-12-11 (×3): 325 mg via ORAL
  Filled 2015-12-09 (×3): qty 1

## 2015-12-09 MED ORDER — LIDOCAINE HCL (PF) 1 % IJ SOLN
INTRAMUSCULAR | Status: DC | PRN
  Administered 2015-12-09 (×2): 6 mL

## 2015-12-09 MED ORDER — FENTANYL 2.5 MCG/ML BUPIVACAINE 1/10 % EPIDURAL INFUSION (WH - ANES)
14.0000 mL/h | INTRAMUSCULAR | Status: DC | PRN
  Administered 2015-12-09 (×2): 14 mL/h via EPIDURAL
  Filled 2015-12-09 (×2): qty 125

## 2015-12-09 MED ORDER — CITRIC ACID-SODIUM CITRATE 334-500 MG/5ML PO SOLN
30.0000 mL | ORAL | Status: DC | PRN
Start: 2015-12-09 — End: 2015-12-09

## 2015-12-09 MED ORDER — DIBUCAINE 1 % RE OINT: 1.0000 "application " | TOPICAL_OINTMENT | RECTAL | Status: DC | PRN

## 2015-12-09 MED ORDER — DIPHENHYDRAMINE HCL 25 MG PO CAPS: 25.0000 mg | ORAL_CAPSULE | Freq: Four times a day (QID) | ORAL | Status: DC | PRN

## 2015-12-09 MED ORDER — FLEET ENEMA 7-19 GM/118ML RE ENEM: 1.0000 | ENEMA | RECTAL | Status: DC | PRN

## 2015-12-09 MED ORDER — METHYLERGONOVINE MALEATE 0.2 MG/ML IJ SOLN: 0.2000 mg | INTRAMUSCULAR | Status: DC | PRN

## 2015-12-09 MED ORDER — SIMETHICONE 80 MG PO CHEW: 80.0000 mg | CHEWABLE_TABLET | ORAL | Status: DC | PRN

## 2015-12-09 MED ORDER — ONDANSETRON HCL 4 MG/2ML IJ SOLN: 4.0000 mg | INTRAMUSCULAR | Status: DC | PRN

## 2015-12-09 MED ORDER — OXYTOCIN 40 UNITS IN LACTATED RINGERS INFUSION - SIMPLE MED
1.0000 m[IU]/min | INTRAVENOUS | Status: DC
  Administered 2015-12-09: 2 m[IU]/min via INTRAVENOUS
  Filled 2015-12-09: qty 1000

## 2015-12-09 MED ORDER — METHYLERGONOVINE MALEATE 0.2 MG PO TABS: 0.2000 mg | ORAL_TABLET | ORAL | Status: DC | PRN

## 2015-12-09 MED ORDER — IBUPROFEN 600 MG PO TABS
600.0000 mg | ORAL_TABLET | Freq: Four times a day (QID) | ORAL | Status: DC
  Administered 2015-12-09 – 2015-12-11 (×7): 600 mg via ORAL
  Filled 2015-12-09 (×7): qty 1

## 2015-12-09 MED ORDER — LACTATED RINGERS IV SOLN: 500.0000 mL | Freq: Once | INTRAVENOUS | Status: DC

## 2015-12-09 MED ORDER — LACTATED RINGERS IV SOLN
500.0000 mL | Freq: Once | INTRAVENOUS | Status: AC
  Administered 2015-12-09: 500 mL via INTRAVENOUS

## 2015-12-09 MED ORDER — ONDANSETRON HCL 4 MG PO TABS: 4.0000 mg | ORAL_TABLET | ORAL | Status: DC | PRN

## 2015-12-09 MED ORDER — ACETAMINOPHEN 325 MG PO TABS: 650.0000 mg | ORAL_TABLET | ORAL | Status: DC | PRN

## 2015-12-09 MED ORDER — PHENYLEPHRINE 40 MCG/ML (10ML) SYRINGE FOR IV PUSH (FOR BLOOD PRESSURE SUPPORT)
80.0000 ug | PREFILLED_SYRINGE | INTRAVENOUS | Status: DC | PRN
  Filled 2015-12-09: qty 10
  Filled 2015-12-09: qty 5
  Filled 2015-12-09: qty 10

## 2015-12-09 MED ORDER — PRENATAL MULTIVITAMIN CH
1.0000 | ORAL_TABLET | Freq: Every day | ORAL | Status: DC
  Administered 2015-12-10 – 2015-12-11 (×2): 1 via ORAL
  Filled 2015-12-09 (×2): qty 1

## 2015-12-09 MED ORDER — BISACODYL 10 MG RE SUPP: 10.0000 mg | Freq: Every day | RECTAL | Status: DC | PRN

## 2015-12-09 MED ORDER — ONDANSETRON HCL 4 MG/2ML IJ SOLN: 4.0000 mg | Freq: Four times a day (QID) | INTRAMUSCULAR | Status: DC | PRN

## 2015-12-09 MED ORDER — LACTATED RINGERS IV SOLN
INTRAVENOUS | Status: DC
  Administered 2015-12-09 (×2): via INTRAVENOUS

## 2015-12-09 MED ORDER — LIDOCAINE HCL (PF) 1 % IJ SOLN
30.0000 mL | INTRAMUSCULAR | Status: DC | PRN
  Filled 2015-12-09: qty 30

## 2015-12-09 MED ORDER — MEASLES, MUMPS & RUBELLA VAC ~~LOC~~ INJ: 0.5000 mL | INJECTION | Freq: Once | SUBCUTANEOUS | Status: DC

## 2015-12-09 MED ORDER — OXYTOCIN 40 UNITS IN LACTATED RINGERS INFUSION - SIMPLE MED: 2.5000 [IU]/h | INTRAVENOUS | Status: DC

## 2015-12-09 MED ORDER — FLEET ENEMA 7-19 GM/118ML RE ENEM: 1.0000 | ENEMA | Freq: Every day | RECTAL | Status: DC | PRN

## 2015-12-09 MED ORDER — DOCUSATE SODIUM 100 MG PO CAPS
100.0000 mg | ORAL_CAPSULE | Freq: Two times a day (BID) | ORAL | Status: DC
  Administered 2015-12-10 – 2015-12-11 (×3): 100 mg via ORAL
  Filled 2015-12-09 (×3): qty 1

## 2015-12-09 MED ORDER — PHENYLEPHRINE 40 MCG/ML (10ML) SYRINGE FOR IV PUSH (FOR BLOOD PRESSURE SUPPORT)
80.0000 ug | PREFILLED_SYRINGE | INTRAVENOUS | Status: DC | PRN
  Filled 2015-12-09: qty 5

## 2015-12-09 MED ORDER — OXYTOCIN BOLUS FROM INFUSION
500.0000 mL | INTRAVENOUS | Status: DC
  Administered 2015-12-09: 500 mL via INTRAVENOUS

## 2015-12-09 MED ORDER — BENZOCAINE-MENTHOL 20-0.5 % EX AERO: 1.0000 "application " | INHALATION_SPRAY | CUTANEOUS | Status: DC | PRN

## 2015-12-09 NOTE — Anesthesia Preprocedure Evaluation (Addendum)
Anesthesia Evaluation  Patient identified by MRN, date of birth, ID band Patient awake    Reviewed: Allergy & Precautions, NPO status , Patient's Chart, lab work & pertinent test results  Airway Mallampati: II  TM Distance: >3 FB Neck ROM: Full    Dental no notable dental hx.    Pulmonary asthma , Current Smoker,    Pulmonary exam normal breath sounds clear to auscultation       Cardiovascular negative cardio ROS Normal cardiovascular exam Rhythm:Regular Rate:Normal     Neuro/Psych  Headaches, PSYCHIATRIC DISORDERS    GI/Hepatic negative GI ROS, Neg liver ROS,   Endo/Other  negative endocrine ROS  Renal/GU negative Renal ROS  negative genitourinary   Musculoskeletal negative musculoskeletal ROS (+)   Abdominal (+) + obese,   Peds negative pediatric ROS (+)  Hematology negative hematology ROS (+)   Anesthesia Other Findings   Reproductive/Obstetrics negative OB ROS                             Anesthesia Physical Anesthesia Plan  ASA: II  Anesthesia Plan: Epidural   Post-op Pain Management:    Induction: Intravenous  Airway Management Planned: Natural Airway  Additional Equipment:   Intra-op Plan:   Post-operative Plan:   Informed Consent: I have reviewed the patients History and Physical, chart, labs and discussed the procedure including the risks, benefits and alternatives for the proposed anesthesia with the patient or authorized representative who has indicated his/her understanding and acceptance.   Dental advisory given  Plan Discussed with: CRNA  Anesthesia Plan Comments: (Informed consent obtained prior to proceeding including risk of failure, 1% risk of PDPH, risk of minor discomfort and bruising.  Discussed rare but serious complications including epidural abscess, permanent nerve injury, epidural hematoma.  Discussed alternatives to epidural analgesia and patient  desires to proceed.  Timeout performed pre-procedure verifying patient name, procedure, and platelet count.  Patient tolerated procedure well. )       Anesthesia Quick Evaluation

## 2015-12-09 NOTE — Anesthesia Pain Management Evaluation Note (Signed)
  CRNA Pain Management Visit Note  Patient: [name] [name], 25 y.o., female  "Hello I am a member of the anesthesia team at [facility]. We have an anesthesia team available at all times to provide care throughout the hospital, including epidural management and anesthesia for C-section. I don't know your plan for the delivery whether it a natural birth, water birth, IV sedation, nitrous supplementation, doula or epidural, but we want to meet your pain goals."   1.Was your pain managed to your expectations on prior hospitalizations?   No   2.What is your expectation for pain management during this hospitalization?     Epidural  3.How can we help you reach that goal? epidural  Record the patient's initial score and the patient's pain goal.   Pain: 2  Pain Goal: 6 [facility] wants you to be able to say your pain was always managed very well.  Casimer Lanius 12/09/2015

## 2015-12-09 NOTE — Anesthesia Procedure Notes (Signed)
Epidural Patient location during procedure: OB  Staffing Anesthesiologist: Franne Grip  Preanesthetic Checklist Completed: patient identified, site marked, surgical consent, pre-op evaluation, timeout performed, IV checked, risks and benefits discussed and monitors and equipment checked  Epidural Patient position: sitting Prep: DuraPrep Patient monitoring: heart rate and blood pressure Approach: midline Location: L3-L4 Injection technique: LOR saline  Needle:  Needle type: Tuohy  Needle gauge: 17 G Needle length: 9 cm Needle insertion depth: 5 cm Catheter type: closed end flexible Catheter size: 19 Gauge Catheter at skin depth: 11 cm Test dose: negative and Other  Assessment Events: blood not aspirated, injection not painful, no injection resistance, negative IV test and no paresthesia  Additional Notes Latex free gloves used. Tolerated well. Discussed tape with patient. She believes the tegaderm and hypafix will not cause a skin problem.Reason for block:procedure for pain

## 2015-12-09 NOTE — H&P (Signed)
LABOR ADMISSION HISTORY AND PHYSICAL  [name] [name] is a 25 y.o. female [phone] with IUP at [redacted]w[redacted]d by 28w [place] presenting for abdominal pain since 2 am this morning after taking castor oil to induce labor. She reports contractions every 3-5 minutes. Fetal movement present and at baseline. Denies fluid leak or gush, vaginal bleeding,  headaches, blurry vision, RUQ pain or peripheral edema. She denies fever or dysuria. She plans on bottle feeding. She request interval BTL for birth control (consent signed). She is planning to take her baby to [place]. She reports shoulder dystoscia with her second baby. He weighed 10 lbs and 4 oz. She thinks this baby is bigger than that. Sono at 28 wks showed normal anatomy with 52% EFW  Dating: By 28 wks [place] --->  Estimated Date of Delivery: 12/04/15  Prenatal History/Complications: Late prenatal care at 28 wks 12/09/2015  ASCUS w/neg HPV Patient with remote history of HSV. She was on acyclovir in the past. Hasn't taken medication this pregnancy. She denies vaginal or perineal lesion.   Past Medical History: Past Medical History  Diagnosis Date  . Asthma   . Migraines     with aura (see flashes)  . Pregnant 09/05/2015  . ADHD     Past Surgical History: Past Surgical History  Procedure Laterality Date  . Dilation and curettage of uterus    . Wisdom tooth extraction    . Jaw wired shut    . Penny removed from esophagus    . Colonoscopy with propofol N/A 12/24/2014    Procedure: COLONOSCOPY WITH PROPOFOL;  Surgeon: Rogene Houston, MD;  Location: [facility];  Service: Endoscopy;  Laterality: N/A;  total cecal time- 18 minutes  . Polypectomy N/A 12/24/2014    Procedure: POLYPECTOMY;  Surgeon: Rogene Houston, MD;  Location: [facility];  Service: Endoscopy;  Laterality: N/A;  . Tooth pulled      Obstetrical History: OB History    Gravida Para Term Preterm AB TAB SAB Ectopic Multiple Living   5 3 3  1  1   3       Social History: Social History    Social History  . Marital Status: Married    Spouse Name: N/A  . Number of Children: N/A  . Years of Education: N/A   Social History Main Topics  . Smoking status: Current Some Day Smoker -- 0.00 packs/day for 10 years    Types: Cigarettes    Last Attempt to Quit: 01/08/2012  . Smokeless tobacco: Never Used  . Alcohol Use: No     Comment: every now and then; not now  . Drug Use: No  . Sexual Activity: Yes    Birth Control/ Protection: None   Other Topics Concern  . None   Social History Narrative    Family History: Family History  Problem Relation Age of Onset  . Diabetes Mother   . Liver disease Mother     fatty liver   . Hypertension Father   . COPD Father   . Hypertension Brother   . COPD Brother   . Heart disease Maternal Grandmother   . Diabetes Maternal Grandmother   . Heart murmur Son   . Other Son     oversized tonsils    Allergies: Allergies  Allergen Reactions  . Darvocet [Propoxyphene N-Acetaminophen] Other (See Comments)    Hallucinations - Dislocated her jaw during a vivid episode  . Imitrex [Sumatriptan Base]     Patient states that imitrex  gives her chest pain  . Adhesive [Tape] Hives  . Codeine Other (See Comments)    unknown  . Percocet [Oxycodone-Acetaminophen] Other (See Comments)    "made me feel funny"   . Latex Rash  . Sulfa Antibiotics Swelling, Rash and Other (See Comments)    Eye drops at child -- caused pain, swelling, & redness.  Her doctor said it was an allergy.    Does not remember name of drug but had, "face" sound in its name.    Prescriptions prior to admission  Medication Sig Dispense Refill Last Dose  . acetaminophen (TYLENOL) 325 MG tablet Take 650 mg by mouth every 6 (six) hours as needed.   Past Week at Unknown time  . castor oil liquid Take 15 mLs by mouth once.   12/08/2015 at Unknown time  . prenatal vitamin w/FE, FA (PRENATAL 1 + 1) 27-1 MG TABS tablet Take 1 tablet by mouth daily at 12 noon. 30 each 11  12/06/2015     Review of Systems  Constitutional: Negative for fever.  Eyes: Negative for visual disturbance.  Cardiovascular: Negative for chest pain and leg swelling.  Gastrointestinal: Negative for nausea, vomiting and constipation.  Genitourinary: Negative for dysuria, vaginal bleeding and vaginal discharge.    Blood pressure 83/51, pulse 104, temperature 98.1 F (36.7 C), temperature source Oral, resp. rate 18, height 5' (1.524 m), weight 181 lb (82.101 kg), unknown if currently breastfeeding. GEN: appearance: alert, cooperative and appears stated age RESP: clear to auscultation bilaterally, no increased WOB CVS:: regular rate and rhythm, no murmurs, no sign of DVT, +2 DP GI: soft, non-tender; bowel sounds normal.  GU: gravid uterus MSK: WWP, Homans sign is negative,   Pelvic Exam: Cervical exam: Dilation: 4.5 Effacement (%): 70 Station: -2 Exam by:: [email or web address],rnc Presentation: cephalic by my exam Uterine activityDate/time of onset: 12/08/2015 at 2 am  Fetal monitoringBaseline: 140/mod var/no decels bpm  Prenatal labs:  Prenatal Transfer [place]  Initiated Care at  [place] By 28 week [place]  Pap 12/09/2015  ASCUS w/neg HPV--repeat w/cotesting 3 years  GC/CT Initial:        -/-        36+wks:-/-  Genetic Screen NT/IT:  Too late  CF screen Neg 12/14  Anatomic [place] normal  Flu vaccine dclined  Tdap Recommended ~ 28wks  Glucose Screen  2 hr 74/115/91  GBS neg  Feed Preference Breast and bottle  Contraception BTL otpt saplingectomy(  [facility]  Child[facility] Classes no  Pediatrician [facility]    No results found for this or any previous visit (from the past 24 hour(s)).  Patient Active Problem List   Diagnosis Date Noted  . ASCUS 10/02/2015  . Supervision of normal pregnancy 09/25/2015  . Pica 09/25/2015  . Pregnant 09/05/2015  . Endometritis 02/27/2014  . Scabies 12/12/2013  . 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 Prior fetal macrosomia, antepartum 09/18/2013  . ADHD   . Migraines     Assessment: MAGGI GRAPER is a 25 y.o. [phone] at [redacted]w[redacted]d here for latent labor. Patient is postdate. She is 4 cm dilated.  #Labor: Latent. Will augment labor with pitocin.  #Pain: Epidural as needed #FWB: CAT-1 #ID: GBS neg. F/u RPR and HIV #MOF: bottle #MOC: interval BTL #Circ:  outpt  Taye T Gonfa 12/09/2015, 11:04 AM  CNM attestation:  I have seen and examined this patient; I agree with above documentation in  the Resident's note.    Clemmons,Lori Terral, CNM 5:57 PM

## 2015-12-09 NOTE — MAU Note (Signed)
Pt took castor oil last night and has been having contractions since then. Pt c/o increased pelvic pressure and pain this morning. Pt denies bleeding and leaking of fluid. Pt states baby is moving normally.

## 2015-12-09 NOTE — Progress Notes (Signed)
Labor Progress Note [name] [name] is a 25 y.o. [phone] at [redacted]w[redacted]d presented for contraction S: patient has no complaint. Lying comfortably. Pain well controlled with epidural  O:  BP 97/53 mmHg  Pulse 92  Temp(Src) 97.3 F (36.3 C) (Oral)  Resp 18  Ht 5' (1.524 m)  Wt 181 lb (82.101 kg)  BMI 35.35 kg/m2  SpO2 99%  LMP  (LMP Unknown) EFM: 140/mod varia/no decels  CVE: Dilation: 5 Effacement (%): 80 Cervical Position: Middle Station: -2 Presentation: Vertex Exam by:: clemmons cnm   A&P: 25 y.o. [id number] [redacted]w[redacted]d with contraction #Labor: AROM at 15:57 by ammniohook. Clear fluid. Patient tolerated the procedure. Will start pitocin. #Pain: well-controlled with epidural #FWB: CAT-1 #GBS: negative  Mercy Riding, MD 4:01 PM

## 2015-12-10 LAB — RPR: RPR: NONREACTIVE

## 2015-12-10 MED ORDER — OXYCODONE HCL 5 MG PO TABS: 5.0000 mg | ORAL_TABLET | Freq: Four times a day (QID) | ORAL | Status: DC | PRN

## 2015-12-10 MED ORDER — OXYCODONE-ACETAMINOPHEN 5-325 MG PO TABS
1.0000 | ORAL_TABLET | Freq: Four times a day (QID) | ORAL | Status: DC | PRN
  Administered 2015-12-10 – 2015-12-11 (×5): 1 via ORAL
  Filled 2015-12-10 (×5): qty 1

## 2015-12-10 NOTE — Progress Notes (Signed)
Post Partum Day 1 Subjective:  [name] [name] is a 25 y.o. [id number] [redacted]w[redacted]d s/p SVD.  No acute events overnight.  Pt denies problems with ambulating, voiding or po intake.  She denies nausea or vomiting.  Pain is well controlled. Lochia Minimal.  Plan for birth control is bilateral tubal ligation as outpatient.  Method of Feeding: breast  Objective: Blood pressure 90/55, pulse 70, temperature 97.9 F (36.6 C), temperature source Oral, resp. rate 18, height 5' (1.524 m), weight 181 lb (82.101 kg), SpO2 95 %, unknown if currently breastfeeding.  Physical Exam:  General: alert, cooperative and no distress Lochia:normal flow Chest: normal WOB Heart: Regular rate Abdomen: +BS, soft, mild TTP (appropriate) Uterine Fundus: firm DVT Evaluation: No evidence of DVT seen on physical exam. Extremities: no edema   Recent Labs  12/09/15 1230  HGB 9.3*  HCT 29.5*    Assessment/Plan:  ASSESSMENT: [name] [name] is a 25 y.o. [id number] [redacted]w[redacted]d s/p NSVD  Plan for discharge tomorrow Continue routine PP care Breastfeeding support PRN  LOS: 1 day   Mercy Riding 12/10/2015, 3:17 PM

## 2015-12-10 NOTE — Anesthesia Postprocedure Evaluation (Signed)
Anesthesia Post Note  Patient: [name] [name]  Procedure(s) Performed: * No procedures listed *  Patient location during evaluation: Mother Baby Anesthesia Type: Epidural Level of consciousness: awake and alert and oriented Pain management: satisfactory to patient Vital Signs Assessment: post-procedure vital signs reviewed and stable Respiratory status: spontaneous breathing and nonlabored ventilation Cardiovascular status: stable Postop Assessment: no headache, no backache, no signs of nausea or vomiting, adequate PO intake and patient able to bend at knees (patient up walking) Anesthetic complications: no     Last Vitals:  Filed Vitals:   12/09/15 2335 12/10/15 0330  BP: 103/54 90/55  Pulse: 91 70  Temp: 36.9 C 36.6 C  Resp: 20 18    Last Pain:  Filed Vitals:   12/10/15 0655  PainSc: 4    Pain Goal: Patients Stated Pain Goal: 0 (12/09/15 2130)               Willa Rough

## 2015-12-11 ENCOUNTER — Inpatient Hospital Stay (HOSPITAL_COMMUNITY): Admission: RE | Admit: 2015-12-11 | Payer: Medicaid Other | Source: Ambulatory Visit

## 2015-12-11 MED ORDER — IBUPROFEN 600 MG PO TABS: 600.0000 mg | ORAL_TABLET | Freq: Four times a day (QID) | ORAL | Status: DC

## 2015-12-11 MED ORDER — SENNA 8.6 MG PO TABS: 1.0000 | ORAL_TABLET | Freq: Every day | ORAL | Status: DC

## 2015-12-11 MED ORDER — FERROUS SULFATE 325 (65 FE) MG PO TABS: 325.0000 mg | ORAL_TABLET | Freq: Two times a day (BID) | ORAL | Status: DC

## 2015-12-11 NOTE — Discharge Instructions (Signed)

## 2015-12-11 NOTE — Discharge Summary (Signed)
Obstetric Discharge Summary Reason for Admission: onset of labor Prenatal Procedures: none Intrapartum Procedures: spontaneous vaginal delivery Postpartum Procedures: none Complications-Operative and Postpartum: none  Delivery Note After a 1 push 2nd stage, At 8:00 PM a viable female was delivered via Vaginal, Spontaneous Delivery (Presentation: Right Occiput Anterior). APGAR 8/9: ; weight pending. After 2 minutes, the cord was clamped and cut. 40 units of pitocin diluted in 1000cc LR was infused rapidly IV. The placenta separated spontaneously and delivered via CCT and maternal pushing effort. It was inspected and appears to be intact with a 3 VC.  Anesthesia: Epidural  Est. Blood Loss (mL): 100  Mom to postpartum. Baby to Couplet care / Skin to Skin.  Hospital Course:  Active Problems:   Active labor at term   [name] [name] is a 25 y.o. [id number] s/p SVD.  Patient was admitted for latent labor.  She has postpartum course that was uncomplicated including no problems with ambulating, PO intake, urination, pain, or bleeding. The pt feels ready to go home and  will be discharged with outpatient follow-up.   Today: No acute events overnight.  Pt denies problems with ambulating, voiding or po intake.  She denies nausea or vomiting.  Pain is well controlled.  She has had flatus. She has not had bowel movement.  Lochia Minimal.  Plan for birth control is bilateral tubal ligation as outpatient.  Method of Feeding: Breast  Physical Exam:  General: alert, cooperative and no distress Lochia: appropriate Uterine Fundus: firm DVT Evaluation: No evidence of DVT seen on physical exam. Negative Homan's sign.  H/H: Lab Results  Component Value Date/Time   HGB 9.3* 12/09/2015 12:30 PM   HCT 29.5* 12/09/2015 12:30 PM   HCT 34.3 09/27/2015 09:46 AM    Discharge Diagnoses: Term Pregnancy-delivered  Discharge Information: Date: 12/11/2015 Activity: pelvic rest Diet: routine  Medications:  Ibuprofen Breast feeding:  Yes Condition: stable Instructions: refer to handout Discharge to: home      Medication List    TAKE these medications        acetaminophen 325 MG tablet  Commonly known as:  TYLENOL  Take 650 mg by mouth every 6 (six) hours as needed.     castor oil liquid  Take 15 mLs by mouth once.     ferrous sulfate 325 (65 FE) MG tablet  Take 1 tablet (325 mg total) by mouth 2 (two) times daily with a meal.     ibuprofen 600 MG tablet  Commonly known as:  ADVIL,MOTRIN  Take 1 tablet (600 mg total) by mouth every 6 (six) hours.     prenatal vitamin w/FE, FA 27-1 MG Tabs tablet  Take 1 tablet by mouth daily at 12 noon.     senna 8.6 MG Tabs tablet  Commonly known as:  SENOKOT  Take 1 tablet (8.6 mg total) by mouth daily.       Follow-up Information    Follow up with [facility] In 6 weeks.   Specialty:  Obstetrics and Gynecology   Contact information:   [address] [place], [place] 12/11/2015,9:12 AM  OB fellow attestation I have seen and examined this patient and agree with above documentation in the resident's note.   [name] [name] is a 25 y.o. [id number] s/p NSVD.   Pain is well controlled.  Plan for birth control is bilateral tubal ligation.  Method of Feeding: Bottle  PE:  BP 96/56  mmHg  Pulse 76  Temp(Src) 98.2 F (36.8 C) (Oral)  Resp 17  Ht 5' (1.524 m)  Wt 181 lb (82.101 kg)  BMI 35.35 kg/m2  SpO2 95%  LMP  (LMP Unknown)  Breastfeeding? Unknown Gen: well appearing Heart: reg rate Lungs: normal WOB Fundus firm Ext: soft, no pain, no edema  No results for input(s): HGB, HCT in the last 72 hours.   Plan: discharge today - postpartum care discussed - f/u clinic in 6 weeks for postpartum visit - Interval BTL - planning on salpingectomy  Caren Macadam, MD 4:47 PM

## 2016-01-15 ENCOUNTER — Ambulatory Visit: Payer: Medicaid Other | Admitting: Advanced Practice Midwife

## 2016-01-15 ENCOUNTER — Encounter: Payer: Self-pay | Admitting: Advanced Practice Midwife

## 2016-01-16 ENCOUNTER — Encounter: Payer: Medicaid Other | Admitting: Obstetrics & Gynecology

## 2016-01-17 ENCOUNTER — Encounter: Payer: Medicaid Other | Admitting: Obstetrics and Gynecology

## 2016-01-20 ENCOUNTER — Ambulatory Visit: Payer: Medicaid Other | Admitting: Obstetrics and Gynecology

## 2016-01-27 ENCOUNTER — Encounter: Payer: Self-pay | Admitting: Obstetrics & Gynecology

## 2016-01-27 ENCOUNTER — Encounter: Payer: Medicaid Other | Admitting: Obstetrics & Gynecology

## 2016-01-27 ENCOUNTER — Ambulatory Visit (INDEPENDENT_AMBULATORY_CARE_PROVIDER_SITE_OTHER): Payer: Medicaid Other | Admitting: Obstetrics & Gynecology

## 2016-01-27 NOTE — Progress Notes (Signed)
Patient ID: [name] [name], female   DOB: Jan 23, 1991, 25 y.o.   MRN: [id number] Subjective:     [name] [name] is a 25 y.o. female who presents for a postpartum visit. She is 6 weeks postpartum following a spontaneous vaginal delivery. I have fully reviewed the prenatal and intrapartum course. The delivery was at 27 gestational weeks. Outcome: spontaneous vaginal delivery. Anesthesia: epidural. Postpartum course has been unremarkable. Baby's course has been unremarkable. Baby is feeding by . Bleeding staining only. Bowel function is normal. Bladder function is normal. Patient is not sexually active. Contraception method is abstinence. Postpartum depression screening: negative.  The following portions of the patient's history were reviewed and updated as appropriate: allergies, current medications, past family history, past medical history, past social history, past surgical history and problem list.  Review of Systems Pertinent items are noted in HPI.   Objective:    BP 110/60 mmHg  Pulse 76  Wt 164 lb (74.39 kg)  LMP 01/22/2016  General:  alert, cooperative and no distress   Breasts:    Lungs:   Heart:    Abdomen: soft, non-tender; bowel sounds normal; no masses,  no organomegaly   Vulva:  normal  Vagina: normal vagina, no discharge, exudate, lesion, or erythema  Cervix:  no lesions  Corpus: normal size, contour, position, consistency, mobility, non-tender  Adnexa:  normal adnexa  Rectal Exam:         Assessment:     normal postpartum exam. Pap smear not done at today's visit.   Plan:    1. Contraception: abstinence 2. Plan BTL in future 3. Follow up in: 2 day or as needed.

## 2016-01-29 ENCOUNTER — Encounter: Payer: Self-pay | Admitting: Obstetrics & Gynecology

## 2016-01-29 ENCOUNTER — Ambulatory Visit (INDEPENDENT_AMBULATORY_CARE_PROVIDER_SITE_OTHER): Payer: Medicaid Other | Admitting: Obstetrics & Gynecology

## 2016-01-29 VITALS — BP 100/60 | HR 72 | Wt 165.0 lb

## 2016-01-29 DIAGNOSIS — Z302 Encounter for sterilization: Secondary | ICD-10-CM

## 2016-01-29 DIAGNOSIS — Z01818 Encounter for other preprocedural examination: Secondary | ICD-10-CM

## 2016-01-29 DIAGNOSIS — Z3009 Encounter for other general counseling and advice on contraception: Secondary | ICD-10-CM

## 2016-01-29 NOTE — Progress Notes (Signed)
Patient ID: [name] [name], female   DOB: 1991-04-05, 25 y.o.   MRN: [id number] Preoperative History and Physical  [name] [name] is a 25 y.o. [id number] with Patient's last menstrual period was 01/22/2016. admitted for a laparoscopic salpingectomy for sterilization.    PMH:    Past Medical History  Diagnosis Date  . Asthma   . Migraines     with aura (see flashes)  . Pregnant 09/05/2015  . ADHD     PSH:     Past Surgical History  Procedure Laterality Date  . Dilation and curettage of uterus    . Wisdom tooth extraction    . Jaw wired shut    . Penny removed from esophagus    . Colonoscopy with propofol N/A 12/24/2014    Procedure: COLONOSCOPY WITH PROPOFOL;  Surgeon: Rogene Houston, MD;  Location: [facility];  Service: Endoscopy;  Laterality: N/A;  total cecal time- 18 minutes  . Polypectomy N/A 12/24/2014    Procedure: POLYPECTOMY;  Surgeon: Rogene Houston, MD;  Location: [facility];  Service: Endoscopy;  Laterality: N/A;  . Tooth pulled      POb/GynH:      OB History    Gravida Para Term Preterm AB TAB SAB Ectopic Multiple Living   5 4 4  1  1   0 4      SH:   Social History  Substance Use Topics  . Smoking status: Current Some Day Smoker -- 0.00 packs/day for 10 years    Types: Cigarettes    Last Attempt to Quit: 01/08/2012  . Smokeless tobacco: Never Used  . Alcohol Use: No     Comment: every now and then; not now    FH:    Family History  Problem Relation Age of Onset  . Diabetes Mother   . Liver disease Mother     fatty liver   . Hypertension Father   . COPD Father   . Hypertension Brother   . COPD Brother   . Heart disease Maternal Grandmother   . Diabetes Maternal Grandmother   . Heart murmur Son   . Other Son     oversized tonsils     Allergies:  Allergies  Allergen Reactions  . Darvocet [Propoxyphene N-Acetaminophen] Other (See Comments)    Hallucinations - Dislocated her jaw during a vivid episode  . Imitrex [Sumatriptan Base]     Patient states  that imitrex gives her chest pain  . Adhesive [Tape] Hives  . Codeine Other (See Comments)    unknown  . Percocet [Oxycodone-Acetaminophen] Other (See Comments)    "made me feel funny"   . Latex Rash  . Sulfa Antibiotics Swelling, Rash and Other (See Comments)    Eye drops at child -- caused pain, swelling, & redness.  Her doctor said it was an allergy.    Does not remember name of drug but had, "face" sound in its name.    Medications:       Current outpatient prescriptions:  .  acetaminophen (TYLENOL) 325 MG tablet, Take 650 mg by mouth every 6 (six) hours as needed., Disp: , Rfl:  .  ibuprofen (ADVIL,MOTRIN) 600 MG tablet, Take 600 mg by mouth every 6 (six) hours as needed., Disp: , Rfl:  .  prenatal vitamin w/FE, FA (PRENATAL 1 + 1) 27-1 MG TABS tablet, Take 1 tablet by mouth daily at 12 noon., Disp: 30 each, Rfl: 11  Review of Systems:   Review of Systems  Constitutional: Negative  for fever, chills, weight loss, malaise/fatigue and diaphoresis.  HENT: Negative for hearing loss, ear pain, nosebleeds, congestion, sore throat, neck pain, tinnitus and ear discharge.   Eyes: Negative for blurred vision, double vision, photophobia, pain, discharge and redness.  Respiratory: Negative for cough, hemoptysis, sputum production, shortness of breath, wheezing and stridor.   Cardiovascular: Negative for chest pain, palpitations, orthopnea, claudication, leg swelling and PND.  Gastrointestinal: Positive for abdominal pain. Negative for heartburn, nausea, vomiting, diarrhea, constipation, blood in stool and melena.  Genitourinary: Negative for dysuria, urgency, frequency, hematuria and flank pain.  Musculoskeletal: Negative for myalgias, back pain, joint pain and falls.  Skin: Negative for itching and rash.  Neurological: Negative for dizziness, tingling, tremors, sensory change, speech change, focal weakness, seizures, loss of consciousness, weakness and headaches.  Endo/Heme/Allergies:  Negative for environmental allergies and polydipsia. Does not bruise/bleed easily.  Psychiatric/Behavioral: Negative for depression, suicidal ideas, hallucinations, memory loss and substance abuse. The patient is not nervous/anxious and does not have insomnia.      PHYSICAL EXAM:  Blood pressure 100/60, pulse 72, weight 165 lb (74.844 kg), last menstrual period 01/22/2016, unknown if currently breastfeeding.    Vitals reviewed. Constitutional: She is oriented to person, place, and time. She appears well-developed and well-nourished.  HENT:  Head: Normocephalic and atraumatic.  Right Ear: External ear normal.  Left Ear: External ear normal.  Nose: Nose normal.  Mouth/Throat: Oropharynx is clear and moist.  Eyes: Conjunctivae and EOM are normal. Pupils are equal, round, and reactive to light. Right eye exhibits no discharge. Left eye exhibits no discharge. No scleral icterus.  Neck: Normal range of motion. Neck supple. No tracheal deviation present. No thyromegaly present.  Cardiovascular: Normal rate, regular rhythm, normal heart sounds and intact distal pulses.  Exam reveals no gallop and no friction rub.   No murmur heard. Respiratory: Effort normal and breath sounds normal. No respiratory distress. She has no wheezes. She has no rales. She exhibits no tenderness.  GI: Soft. Bowel sounds are normal. She exhibits no distension and no mass. There is tenderness. There is no rebound and no guarding.  Genitourinary:       Vulva is normal without lesions Vagina is pink moist without discharge Cervix normal in appearance and pap is normal Uterus is normal size, contour, position, consistency, mobility, non-tender Adnexa is negative with normal sized ovaries by sonogram  Musculoskeletal: Normal range of motion. She exhibits no edema and no tenderness.  Neurological: She is alert and oriented to person, place, and time. She has normal reflexes. She displays normal reflexes. No cranial nerve  deficit. She exhibits normal muscle tone. Coordination normal.  Skin: Skin is warm and dry. No rash noted. No erythema. No pallor.  Psychiatric: She has a normal mood and affect. Her behavior is normal. Judgment and thought content normal.    Labs: No results found for this or any previous visit (from the past 336 hour(s)).  EKG: Orders placed or performed in visit on 10/25/13  . EKG 12-Lead    Imaging Studies: No results found.    Assessment: Multiparous female desires sterilization  Patient Active Problem List   Diagnosis Date Noted  . Active labor at term 12/09/2015  . ASCUS 10/02/2015  . Supervision of normal pregnancy 09/25/2015  . Pica 09/25/2015  . Pregnant 09/05/2015  . Endometritis 02/27/2014  . Scabies 12/12/2013  . 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 Prior fetal macrosomia, antepartum 09/18/2013  . ADHD   . Migraines     Plan:  Desires laparoscopic bilateral salpingectomy for sterilization 02/12/2016  Florian Buff 01/29/2016 3:44 PM

## 2016-02-04 ENCOUNTER — Other Ambulatory Visit: Payer: Self-pay | Admitting: Obstetrics & Gynecology

## 2016-02-04 NOTE — Patient Instructions (Signed)
[name] [name]  02/04/2016     @PREFPERIOPPHARMACY @   Your procedure is scheduled on  02/12/2016 .  Report to [facility] at  1000  A.M.  Call this number if you have problems the morning of surgery:  [phone]   Remember:  Do not eat food or drink liquids after midnight.  Take these medicines the morning of surgery with A SIP OF WATER  none   Do not wear jewelry, make-up or nail polish.  Do not wear lotions, powders, or perfumes.  You may wear deoderant.  Do not shave 48 hours prior to surgery.  Men may shave face and neck.  Do not bring valuables to the hospital.  [facility] is not responsible for any belongings or valuables.  Contacts, dentures or bridgework may not be worn into surgery.  Leave your suitcase in the car.  After surgery it may be brought to your room.  For patients admitted to the hospital, discharge time will be determined by your treatment team.  Patients discharged the day of surgery will not be allowed to drive home.   Name and phone number of your driver:   family Special instructions:  none  Please read over the following fact sheets that you were given. Coughing and Deep Breathing, Surgical Site Infection Prevention, Anesthesia Post-op Instructions and Care and Recovery After Surgery      Salpingectomy Salpingectomy, also called tubectomy, is the surgical removal of one of the fallopian tubes. The fallopian tubes are tubes that are connected to the uterus. These tubes transport the egg from the ovary to the uterus. A salpingectomy may be done for various reasons, including:   A tubal (ectopic) pregnancy. This is especially true if the tube ruptures.  An infected fallopian tube.  The need to remove the fallopian tube when removing an ovary with a cyst or tumor.  The need to remove the fallopian tube when removing the uterus.  Cancer of the fallopian tube or nearby organs. Removing one fallopian tube does not prevent you  from becoming pregnant. It also does not cause problems with your menstrual periods.  LET [facility] CARE PROVIDER KNOW ABOUT:  Any allergies you have.  All medicines you are taking, including vitamins, herbs, eye drops, creams, and over-the-counter medicines.  Previous problems you or members of your family have had with the use of anesthetics.  Any blood disorders you have.  Previous surgeries you have had.  Medical conditions you have. RISKS AND COMPLICATIONS  Generally, this is a safe procedure. However, as with any procedure, complications can occur. Possible complications include:  Injury to surrounding organs.  Bleeding.  Infection.  Problems related to anesthesia. BEFORE THE PROCEDURE  Ask your health care provider about changing or stopping your regular medicines. You may need to stop taking certain medicines, such as aspirin or blood thinners, at least 1 week before the surgery.  Do not eat or drink anything for at least 8 hours before the surgery.  If you smoke, do not smoke for at least 2 weeks before the surgery.  Make plans to have someone drive you home after the procedure or after your hospital stay. Also arrange for someone to help you with activities during recovery. PROCEDURE   You will be given medicine to help you relax before the procedure (sedative). You will then be given medicine to make you sleep through the procedure (general anesthetic). These medicines will  be given through an IV access tube that is put into one of your veins.  Once you are asleep, your lower abdomen will be shaved and cleaned. A thin, flexible tube (catheter) will be placed in your bladder.  The surgeon may use a laparoscopic, robotic, or open technique for this surgery:  In the laparoscopic technique, the surgery is done through two small cuts (incisions) in the abdomen. A thin, lighted tube with a tiny camera on the end (laparoscope) is inserted into one of the incisions. The  tools needed for the procedure are put through the other incision.  A robotic technique may be chosen to perform complex surgery in a small space. In the robotic technique, small incisions will be made. A camera and surgical instruments are passed through the incisions. Surgical instruments will be controlled with the help of a robotic arm.  In the open technique, the surgery is done through one large incision in the abdomen.  Using any of these techniques, the surgeon removes the fallopian tube from where it attaches to the uterus. The blood vessels will be clamped and tied.  The surgeon then uses staples or stitches to close the incision or incisions. AFTER THE PROCEDURE   You will be taken to a recovery area where your progress will be monitored for 1-3 hours.  If the laparoscopic technique was used, you may be allowed to go home after several hours. You may have some shoulder pain after the laparoscopic procedure. This is normal and usually goes away in a day or two.  If the open technique was used, you will be admitted to the hospital for a couple of days.  You will be given pain medicine if needed.  The IV access tube and catheter will be removed before you are discharged.   This information is not intended to replace advice given to you by your health care provider. Make sure you discuss any questions you have with your health care provider.   Document Released: 11/22/2008 Document Revised: 07/27/2014 Document Reviewed: 12/28/2012 Elsevier Interactive Patient Education 2016 [place] After Refer to this sheet in the next few weeks. These instructions provide you with information on caring for yourself after your procedure. Your health care provider may also give you more specific instructions. Your treatment has been planned according to current medical practices, but problems sometimes occur. Call your health care provider if you have any problems or  questions after your procedure. WHAT TO EXPECT AFTER THE PROCEDURE After your procedure, it is typical to have the following:  Abdominal pain that can be controlled with pain medicine.  Vaginal spotting.  Tiredness. HOME CARE INSTRUCTIONS  Get plenty of rest and sleep.  Only take over-the-counter or prescription medicines as directed by your health care provider.  Keep incision areas clean and dry. Remove or change any bandages (dressings) only as directed by your health care provider.  You may resume your regular diet. Eat a well-balanced diet.  Drink enough fluids to keep your urine clear or pale yellow.  Limit exercise and activities as directed by your health care provider. Do not lift anything heavier than 5 lb (2.3 kg) until your health care provider approves.  Do not drive until your health care provider approves.  Do not have sexual intercourse until your health care provider says it is okay.  Take your temperature twice a day for the first week. Write those temperatures down.  Follow up with your health care provider as directed.  SEEK MEDICAL CARE IF:  You have pain when you urinate.  You see pus coming out of the incision, or the incision is separating.  You have increasing abdominal pain.  You have swelling or redness in the incision area.  You develop a rash.  You feel lightheaded.  You have pain that is not controlled with medicine. SEEK IMMEDIATE MEDICAL CARE IF:  You develop a fever.  You have increasing abdominal pain.  You develop chest or leg pain.  You develop shortness of breath.  You pass out.   This information is not intended to replace advice given to you by your health care provider. Make sure you discuss any questions you have with your health care provider.   Document Released: 10/10/2010 Document Revised: 07/27/2014 Document Reviewed: 12/28/2012 Elsevier Interactive Patient Education 2016 [place], Female Female sterilization is a procedure to permanently prevent pregnancy. There are different ways to perform sterilization, but all either block or close the fallopian tubes so that your eggs cannot reach your uterus. If your egg cannot reach your uterus, sperm cannot fertilize the egg, and you cannot get pregnant.  Sterilization is performed by a surgical procedure. Sometimes these procedures are performed in a hospital while a patient is asleep. Sometimes they can be done in a clinic setting with the patient awake. The fallopian tubes can be surgically cut, tied, or sealed through a procedure called tubal ligation. The fallopian tubes can also be closed with clips or rings. Sterilization can also be done by placing a tiny coil into each fallopian tube, which causes scar tissue to grow inside the tube. The scar tissue then blocks the tubes.  Discuss sterilization with your caregiver to answer any concerns you or your partner may have. You may want to ask what type of sterilization your caregiver performs. Some caregivers may not perform all the various options. Sterilization is permanent and should only be done if you are sure you do not want children or do not want any more children. Having a sterilization reversed may not be successful.  STERILIZATION PROCEDURES  Laparoscopic sterilization. This is a surgical method performed at a time other than right after childbirth. Two incisions are made in the lower abdomen. A thin, lighted tube (laparoscope) is inserted into one of the incisions and is used to perform the procedure. The fallopian tubes are closed with a ring or a clip. An instrument that uses heat could be used to seal the tubes closed (electrocautery).   Mini-laparotomy. This is a surgical method done 1 or 2 days after giving birth. Typically, a small incision is made just below the belly button (umbilicus) and the fallopian tubes are exposed. The tubes can then be sealed, tied,  or cut.   Hysteroscopic sterilization. This is performed at a time other than right after childbirth. A tiny, spring-like coil is inserted through the cervix and uterus and placed into the fallopian tubes. The coil causes scaring and blocks the tubes. Other forms of contraception should be used for 3 months after the procedure to allow the scar tissue to form completely. Additionally, it is required hysterosalpingography be done 3 months later to ensure that the procedure was successful. Hysterosalpingography is a procedure that uses X-rays to look at your uterus and fallopian tubes after a material to make them show up better has been inserted. IS STERILIZATION SAFE? Sterilization is considered safe with very rare complications. Risks depend on the type of procedure you have. As with any  surgical procedure, there are risks. Some risks of sterilization by any means include:   Bleeding.  Infection.  Reaction to anesthesia medicine.  Injury to surrounding organs. Risks specific to having hysteroscopic coils placed include:  The coils may not be placed correctly the first time.   The coils may move out of place.   The tubes may not get completely blocked after 3 months.   Injury to surrounding organs when placing the coil.  HOW EFFECTIVE IS FEMALE STERILIZATION? Sterilization is nearly 100% effective, but it can fail. Depending on the type of sterilization, the rate of failure can be as high as 3%. After hysteroscopic sterilization with placement of fallopian tube coils, you will need back-up birth control for 3 months after the procedure. Sterilization is effective for a lifetime.  BENEFITS OF STERILIZATION  It does not affect your hormones, and therefore will not affect your menstrual periods, sexual desire, or performance.   It is effective for a lifetime.   It is safe.   You do not need to worry about getting pregnant. Keep in mind that if you had the hysteroscopic  placement procedure, you must wait 3 months after the procedure (or until your caregiver confirms) before pregnancy is not considered possible.   There are no side effects unlike other types of birth control (contraception).  DRAWBACKS OF STERILIZATION  You must be sure you do not want children or any more children. The procedure is permanent.   It does not provide protection against sexually transmitted infections (STIs).   The tubes can grow back together. If this happens, there is a risk of pregnancy. There is also an increased risk (50%) of pregnancy being an ectopic pregnancy. This is a pregnancy that happens outside of the uterus.   This information is not intended to replace advice given to you by your health care provider. Make sure you discuss any questions you have with your health care provider.   Document Released: 12/23/2007 Document Revised: 07/11/2013 Document Reviewed: 10/22/2011 Elsevier Interactive Patient Education 2016 [place] Anesthesia, Adult General anesthesia is a sleep-like state of non-feeling produced by medicines (anesthetics). General anesthesia prevents you from being alert and feeling pain during a medical procedure. Your caregiver may recommend general anesthesia if your procedure:  Is long.  Is painful or uncomfortable.  Would be frightening to see or hear.  Requires you to be still.  Affects your breathing.  Causes significant blood loss. LET YOUR CAREGIVER KNOW ABOUT:  Allergies to food or medicine.  Medicines taken, including vitamins, herbs, eyedrops, over-the-counter medicines, and creams.  Use of steroids (by mouth or creams).  Previous problems with anesthetics or numbing medicines, including problems experienced by relatives.  History of bleeding problems or blood clots.  Previous surgeries and types of anesthetics received.  Possibility of pregnancy, if this applies.  Use of cigarettes, alcohol, or illegal  drugs.  Any health condition(s), especially diabetes, sleep apnea, and high blood pressure. RISKS AND COMPLICATIONS General anesthesia rarely causes complications. However, if complications do occur, they can be life threatening. Complications include:  A lung infection.  A stroke.  A heart attack.  Waking up during the procedure. When this occurs, the patient may be unable to move and communicate that he or she is awake. The patient may feel severe pain. Older adults and adults with serious medical problems are more likely to have complications than adults who are young and healthy. Some complications can be prevented by answering all of your caregiver's  questions thoroughly and by following all pre-procedure instructions. It is important to tell your caregiver if any of the pre-procedure instructions, especially those related to diet, were not followed. Any food or liquid in the stomach can cause problems when you are under general anesthesia. BEFORE THE PROCEDURE  Ask your caregiver if you will have to spend the night at the hospital. If you will not have to spend the night, arrange to have an adult drive you and stay with you for 24 hours.  Follow your caregiver's instructions if you are taking dietary supplements or medicines. Your caregiver may tell you to stop taking them or to reduce your dosage.  Do not smoke for as long as possible before your procedure. If possible, stop smoking 3-6 weeks before the procedure.  Do not take new dietary supplements or medicines within 1 week of your procedure unless your caregiver approves them.  Do not eat within 8 hours of your procedure or as directed by your caregiver. Drink only clear liquids, such as water, black coffee (without milk or cream), and fruit juices (without pulp).  Do not drink within 3 hours of your procedure or as directed by your caregiver.  You may brush your teeth on the morning of the procedure, but make sure to spit out  the toothpaste and water when finished. PROCEDURE  You will receive anesthetics through a mask, through an intravenous (IV) access tube, or through both. A doctor who specializes in anesthesia (anesthesiologist) or a nurse who specializes in anesthesia (nurse anesthetist) or both will stay with you throughout the procedure to make sure you remain unconscious. He or she will also watch your blood pressure, pulse, and oxygen levels to make sure that the anesthetics do not cause any problems. Once you are asleep, a breathing tube or mask may be used to help you breathe. AFTER THE PROCEDURE You will wake up after the procedure is complete. You may be in the room where the procedure was performed or in a recovery area. You may have a sore throat if a breathing tube was used. You may also feel:  Dizzy.  Weak.  Drowsy.  Confused.  Nauseous.  Cold. These are all normal responses and can be expected to last for up to 24 hours after the procedure is complete. A caregiver will tell you when you are ready to go home. This will usually be when you are fully awake and in stable condition.   This information is not intended to replace advice given to you by your health care provider. Make sure you discuss any questions you have with your health care provider.   Document Released: 10/13/2007 Document Revised: 07/27/2014 Document Reviewed: 11/04/2011 Elsevier Interactive Patient Education 2016 [place] Anesthesia, Adult, Care After Refer to this sheet in the next few weeks. These instructions provide you with information on caring for yourself after your procedure. Your health care provider may also give you more specific instructions. Your treatment has been planned according to current medical practices, but problems sometimes occur. Call your health care provider if you have any problems or questions after your procedure. WHAT TO EXPECT AFTER THE PROCEDURE After the procedure, it is typical  to experience:  Sleepiness.  Nausea and vomiting. HOME CARE INSTRUCTIONS  For the first 24 hours after general anesthesia:  Have a responsible person with you.  Do not drive a car. If you are alone, do not take public transportation.  Do not drink alcohol.  Do not take  medicine that has not been prescribed by your health care provider.  Do not sign important papers or make important decisions.  You may resume a normal diet and activities as directed by your health care provider.  Change bandages (dressings) as directed.  If you have questions or problems that seem related to general anesthesia, call the hospital and ask for the anesthetist or anesthesiologist on call. SEEK MEDICAL CARE IF:  You have nausea and vomiting that continue the day after anesthesia.  You develop a rash. SEEK IMMEDIATE MEDICAL CARE IF:   You have difficulty breathing.  You have chest pain.  You have any allergic problems.   This information is not intended to replace advice given to you by your health care provider. Make sure you discuss any questions you have with your health care provider.   Document Released: 10/12/2000 Document Revised: 07/27/2014 Document Reviewed: 11/04/2011 Elsevier Interactive Patient Education [employer].

## 2016-02-06 ENCOUNTER — Encounter (HOSPITAL_COMMUNITY): Payer: Self-pay

## 2016-02-06 ENCOUNTER — Encounter (HOSPITAL_COMMUNITY)
Admission: RE | Admit: 2016-02-06 | Discharge: 2016-02-06 | Disposition: A | Discharge status: Home / Self Care | Payer: Medicaid Other | Source: Ambulatory Visit | Attending: Obstetrics & Gynecology | Admitting: Obstetrics & Gynecology

## 2016-02-06 DIAGNOSIS — Z01812 Encounter for preprocedural laboratory examination: Secondary | ICD-10-CM | POA: Insufficient documentation

## 2016-02-06 HISTORY — DX: Unspecified glaucoma: H40.9

## 2016-02-06 HISTORY — DX: Anemia, unspecified: D64.9

## 2016-02-06 HISTORY — DX: Unspecified osteoarthritis, unspecified site: M19.90

## 2016-02-06 LAB — CBC
HEMATOCRIT: 37.8 % (ref 36.0–46.0)
HEMOGLOBIN: 12 g/dL (ref 12.0–15.0)
MCH: 24.7 pg — AB (ref 26.0–34.0)
MCHC: 31.7 g/dL (ref 30.0–36.0)
MCV: 77.9 fL — ABNORMAL LOW (ref 78.0–100.0)
Platelets: 318 10*3/uL (ref 150–400)
RBC: 4.85 MIL/uL (ref 3.87–5.11)
RDW: 19 % — AB (ref 11.5–15.5)
WBC: 12.5 10*3/uL — ABNORMAL HIGH (ref 4.0–10.5)

## 2016-02-06 LAB — URINALYSIS, ROUTINE W REFLEX MICROSCOPIC
Bilirubin Urine: NEGATIVE
Glucose, UA: NEGATIVE mg/dL
Hgb urine dipstick: NEGATIVE
Ketones, ur: NEGATIVE mg/dL
LEUKOCYTES UA: NEGATIVE
NITRITE: NEGATIVE
PH: 5.5 (ref 5.0–8.0)
Protein, ur: NEGATIVE mg/dL
SPECIFIC GRAVITY, URINE: 1.02 (ref 1.005–1.030)

## 2016-02-06 LAB — COMPREHENSIVE METABOLIC PANEL
ALK PHOS: 55 U/L (ref 38–126)
ALT: 19 U/L (ref 14–54)
ANION GAP: 5 (ref 5–15)
AST: 18 U/L (ref 15–41)
Albumin: 3.9 g/dL (ref 3.5–5.0)
BILIRUBIN TOTAL: 0.4 mg/dL (ref 0.3–1.2)
BUN: 7 mg/dL (ref 6–20)
CALCIUM: 8.9 mg/dL (ref 8.9–10.3)
CO2: 23 mmol/L (ref 22–32)
Chloride: 109 mmol/L (ref 101–111)
Creatinine, Ser: 0.77 mg/dL (ref 0.44–1.00)
Glucose, Bld: 73 mg/dL (ref 65–99)
POTASSIUM: 3.7 mmol/L (ref 3.5–5.1)
Sodium: 137 mmol/L (ref 135–145)
TOTAL PROTEIN: 6.3 g/dL — AB (ref 6.5–8.1)

## 2016-02-06 LAB — HCG, QUANTITATIVE, PREGNANCY: hCG, Beta Chain, Quant, S: <1 m[IU]/mL (ref <5)

## 2016-02-06 NOTE — Pre-Procedure Instructions (Signed)
Patient given information to sign up for my chart at home. 

## 2016-02-12 ENCOUNTER — Ambulatory Visit (HOSPITAL_COMMUNITY): Payer: Medicaid Other | Admitting: Anesthesiology

## 2016-02-12 ENCOUNTER — Encounter (HOSPITAL_COMMUNITY)
Admission: RE | Disposition: A | Discharge status: Home / Self Care | Payer: Self-pay | Source: Ambulatory Visit | Attending: Obstetrics & Gynecology

## 2016-02-12 ENCOUNTER — Encounter (HOSPITAL_COMMUNITY): Payer: Self-pay | Admitting: *Deleted

## 2016-02-12 ENCOUNTER — Ambulatory Visit (HOSPITAL_COMMUNITY)
Admission: RE | Admit: 2016-02-12 | Discharge: 2016-02-12 | Disposition: A | Discharge status: Home / Self Care | Payer: Medicaid Other | Source: Ambulatory Visit | Attending: Obstetrics & Gynecology | Admitting: Obstetrics & Gynecology

## 2016-02-12 DIAGNOSIS — F17210 Nicotine dependence, cigarettes, uncomplicated: Secondary | ICD-10-CM | POA: Diagnosis not present

## 2016-02-12 DIAGNOSIS — Z4002 Encounter for prophylactic removal of ovary: Secondary | ICD-10-CM | POA: Diagnosis not present

## 2016-02-12 DIAGNOSIS — Z302 Encounter for sterilization: Secondary | ICD-10-CM | POA: Diagnosis not present

## 2016-02-12 HISTORY — PX: LAPAROSCOPIC BILATERAL SALPINGECTOMY: SHX5889

## 2016-02-12 SURGERY — SALPINGECTOMY, BILATERAL, LAPAROSCOPIC
Anesthesia: General | Site: Abdomen | Laterality: Bilateral

## 2016-02-12 MED ORDER — LIDOCAINE HCL (CARDIAC) 20 MG/ML IV SOLN
INTRAVENOUS | Status: DC | PRN
  Administered 2016-02-12: 50 mg via INTRAVENOUS

## 2016-02-12 MED ORDER — FENTANYL CITRATE (PF) 250 MCG/5ML IJ SOLN
INTRAMUSCULAR | Status: AC
  Filled 2016-02-12: qty 5

## 2016-02-12 MED ORDER — PROPOFOL 10 MG/ML IV BOLUS
INTRAVENOUS | Status: DC | PRN
  Administered 2016-02-12: 120 mg via INTRAVENOUS

## 2016-02-12 MED ORDER — KETOROLAC TROMETHAMINE 10 MG PO TABS: 10.0000 mg | ORAL_TABLET | Freq: Three times a day (TID) | ORAL | 0 refills | Status: AC | PRN

## 2016-02-12 MED ORDER — NEOSTIGMINE METHYLSULFATE 10 MG/10ML IV SOLN
INTRAVENOUS | Status: DC | PRN
  Administered 2016-02-12: 4 mg via INTRAVENOUS

## 2016-02-12 MED ORDER — HYDROCODONE-ACETAMINOPHEN 5-325 MG PO TABS: 1.0000 | ORAL_TABLET | Freq: Four times a day (QID) | ORAL | 0 refills | Status: DC | PRN

## 2016-02-12 MED ORDER — CEFAZOLIN SODIUM-DEXTROSE 2-4 GM/100ML-% IV SOLN
2.0000 g | INTRAVENOUS | Status: AC
  Administered 2016-02-12: 2 g via INTRAVENOUS
  Filled 2016-02-12: qty 100

## 2016-02-12 MED ORDER — MIDAZOLAM HCL 2 MG/2ML IJ SOLN
1.0000 mg | INTRAMUSCULAR | Status: DC | PRN
  Administered 2016-02-12: 2 mg via INTRAVENOUS
  Filled 2016-02-12: qty 2

## 2016-02-12 MED ORDER — BUPIVACAINE LIPOSOME 1.3 % IJ SUSP: 20.0000 mL | Freq: Once | INTRAMUSCULAR | Status: DC

## 2016-02-12 MED ORDER — FENTANYL CITRATE (PF) 100 MCG/2ML IJ SOLN
25.0000 ug | INTRAMUSCULAR | Status: DC | PRN
  Administered 2016-02-12: 25 ug via INTRAVENOUS
  Administered 2016-02-12: 50 ug via INTRAVENOUS
  Administered 2016-02-12: 25 ug via INTRAVENOUS
  Administered 2016-02-12: 50 ug via INTRAVENOUS
  Filled 2016-02-12 (×2): qty 2

## 2016-02-12 MED ORDER — PROPOFOL 10 MG/ML IV BOLUS
INTRAVENOUS | Status: AC
  Filled 2016-02-12: qty 20

## 2016-02-12 MED ORDER — ONDANSETRON HCL 8 MG PO TABS: 8.0000 mg | ORAL_TABLET | Freq: Three times a day (TID) | ORAL | 0 refills | Status: AC | PRN

## 2016-02-12 MED ORDER — BUPIVACAINE LIPOSOME 1.3 % IJ SUSP
INTRAMUSCULAR | Status: DC | PRN
  Administered 2016-02-12: 20 mL

## 2016-02-12 MED ORDER — SODIUM CHLORIDE 0.9 % IR SOLN
Status: DC | PRN
  Administered 2016-02-12: 1000 mL

## 2016-02-12 MED ORDER — ONDANSETRON HCL 4 MG/2ML IJ SOLN
4.0000 mg | Freq: Once | INTRAMUSCULAR | Status: AC
  Administered 2016-02-12: 4 mg via INTRAVENOUS
  Filled 2016-02-12: qty 2

## 2016-02-12 MED ORDER — BUPIVACAINE LIPOSOME 1.3 % IJ SUSP
INTRAMUSCULAR | Status: AC
  Filled 2016-02-12: qty 20

## 2016-02-12 MED ORDER — GLYCOPYRROLATE 0.2 MG/ML IJ SOLN
INTRAMUSCULAR | Status: DC | PRN
  Administered 2016-02-12: 0.6 mg via INTRAVENOUS

## 2016-02-12 MED ORDER — LIDOCAINE HCL (PF) 1 % IJ SOLN
INTRAMUSCULAR | Status: AC
  Filled 2016-02-12: qty 5

## 2016-02-12 MED ORDER — FENTANYL CITRATE (PF) 100 MCG/2ML IJ SOLN
INTRAMUSCULAR | Status: DC | PRN
  Administered 2016-02-12 (×5): 50 ug via INTRAVENOUS

## 2016-02-12 MED ORDER — LACTATED RINGERS IV SOLN
INTRAVENOUS | Status: DC
  Administered 2016-02-12 (×2): via INTRAVENOUS

## 2016-02-12 MED ORDER — ARTIFICIAL TEARS OP OINT
TOPICAL_OINTMENT | OPHTHALMIC | Status: AC
  Filled 2016-02-12: qty 3.5

## 2016-02-12 MED ORDER — ROCURONIUM BROMIDE 100 MG/10ML IV SOLN
INTRAVENOUS | Status: DC | PRN
  Administered 2016-02-12: 30 mg via INTRAVENOUS

## 2016-02-12 MED ORDER — ONDANSETRON HCL 4 MG/2ML IJ SOLN
4.0000 mg | Freq: Once | INTRAMUSCULAR | Status: AC | PRN
  Administered 2016-02-12: 4 mg via INTRAVENOUS
  Filled 2016-02-12: qty 2

## 2016-02-12 MED ORDER — KETOROLAC TROMETHAMINE 30 MG/ML IJ SOLN
30.0000 mg | Freq: Once | INTRAMUSCULAR | Status: AC
  Administered 2016-02-12: 30 mg via INTRAVENOUS
  Filled 2016-02-12: qty 1

## 2016-02-12 SURGICAL SUPPLY — 47 items
APPLIER CLIP 5 13 M/L LIGAMAX5 (MISCELLANEOUS)
BAG HAMPER (MISCELLANEOUS) ×3 IMPLANT
BLADE SURG SZ11 CARB STEEL (BLADE) ×3 IMPLANT
CLIP APPLIE 5 13 M/L LIGAMAX5 (MISCELLANEOUS) IMPLANT
CLOTH BEACON ORANGE TIMEOUT ST (SAFETY) ×3 IMPLANT
COVER LIGHT HANDLE STERIS (MISCELLANEOUS) ×6 IMPLANT
DRAPE PROXIMA HALF (DRAPES) ×3 IMPLANT
ELECT REM PT RETURN 9FT ADLT (ELECTROSURGICAL) ×3
ELECTRODE REM PT RTRN 9FT ADLT (ELECTROSURGICAL) ×1 IMPLANT
FILTER SMOKE EVAC LAPAROSHD (FILTER) IMPLANT
FORMALIN 10 PREFIL 120ML (MISCELLANEOUS) ×6 IMPLANT
GLOVE BIOGEL PI IND STRL 7.0 (GLOVE) ×1 IMPLANT
GLOVE BIOGEL PI IND STRL 8 (GLOVE) IMPLANT
GLOVE BIOGEL PI INDICATOR 7.0 (GLOVE) ×2
GLOVE BIOGEL PI INDICATOR 8 (GLOVE)
GLOVE ECLIPSE 8.0 STRL XLNG CF (GLOVE) IMPLANT
GLOVE EXAM NITRILE PF MED BLUE (GLOVE) ×3 IMPLANT
GLOVE SURG SS PI 6.5 STRL IVOR (GLOVE) ×3 IMPLANT
GLOVE SURG SS PI 8.0 STRL IVOR (GLOVE) ×3 IMPLANT
GOWN STRL REUS W/TWL LRG LVL3 (GOWN DISPOSABLE) ×3 IMPLANT
GOWN STRL REUS W/TWL XL LVL3 (GOWN DISPOSABLE) ×3 IMPLANT
INST SET LAPROSCOPIC GYN AP (KITS) ×3 IMPLANT
IV NS IRRIG 3000ML ARTHROMATIC (IV SOLUTION) IMPLANT
KIT ROOM TURNOVER AP CYSTO (KITS) ×3 IMPLANT
MANIFOLD NEPTUNE II (INSTRUMENTS) ×3 IMPLANT
NEEDLE HYPO 21X1.5 SAFETY (NEEDLE) ×3 IMPLANT
NEEDLE INSUFFLATION 14GA 120MM (NEEDLE) ×3 IMPLANT
NEEDLE SPNL 22GX3.5 QUINCKE BK (NEEDLE) ×3 IMPLANT
PACK PERI GYN (CUSTOM PROCEDURE TRAY) ×3 IMPLANT
PAD ARMBOARD 7.5X6 YLW CONV (MISCELLANEOUS) ×3 IMPLANT
SET BASIN LINEN APH (SET/KITS/TRAYS/PACK) ×3 IMPLANT
SET TUBE IRRIG SUCTION NO TIP (IRRIGATION / IRRIGATOR) IMPLANT
SHEARS HARMONIC ACE PLUS 36CM (ENDOMECHANICALS) ×3 IMPLANT
SLEEVE ENDOPATH XCEL 5M (ENDOMECHANICALS) ×3 IMPLANT
SOLUTION ANTI FOG 6CC (MISCELLANEOUS) ×3 IMPLANT
SPONGE GAUZE 2X2 8PLY STER LF (GAUZE/BANDAGES/DRESSINGS) ×3
SPONGE GAUZE 2X2 8PLY STRL LF (GAUZE/BANDAGES/DRESSINGS) ×6 IMPLANT
STAPLER VISISTAT 35W (STAPLE) ×6 IMPLANT
SUT PDS AB CT VIOLET #0 27IN (SUTURE) ×3 IMPLANT
SUT VICRYL 0 UR6 27IN ABS (SUTURE) ×3 IMPLANT
SYR 20CC LL (SYRINGE) ×3 IMPLANT
SYRINGE 10CC LL (SYRINGE) ×3 IMPLANT
TAPE CLOTH SURG 4X10 WHT LF (GAUZE/BANDAGES/DRESSINGS) ×3 IMPLANT
TROCAR ENDO BLADELESS 11MM (ENDOMECHANICALS) ×3 IMPLANT
TROCAR XCEL NON-BLD 5MMX100MML (ENDOMECHANICALS) ×3 IMPLANT
TUBING INSUF HEATED (TUBING) ×3 IMPLANT
WARMER LAPAROSCOPE (MISCELLANEOUS) ×3 IMPLANT

## 2016-02-12 NOTE — Progress Notes (Signed)
drsg to right lower abd changed for small amt of red drainage. No active drainage. Incision intact. 2x2 gauze and medipore tape applied. Tolerated well.

## 2016-02-12 NOTE — Anesthesia Preprocedure Evaluation (Signed)
Anesthesia Evaluation  Patient identified by MRN, date of birth, ID band Patient awake    Reviewed: Allergy & Precautions, H&P , NPO status , Patient's Chart, lab work & pertinent test results  Airway Mallampati: II  TM Distance: >3 FB Neck ROM: full    Dental  (+) Poor Dentition, Dental Advisory Given, Chipped,    Pulmonary asthma , Current Smoker,    breath sounds clear to auscultation       Cardiovascular negative cardio ROS   Rhythm:regular Rate:Normal     Neuro/Psych  Headaches, PSYCHIATRIC DISORDERS Depression ADHD    GI/Hepatic negative GI ROS,   Endo/Other    Renal/GU      Musculoskeletal   Abdominal   Peds  Hematology  (+) anemia ,   Anesthesia Other Findings       Reproductive/Obstetrics                            Anesthesia Physical Anesthesia Plan  ASA: II  Anesthesia Plan: General   Post-op Pain Management:    Induction: Intravenous  Airway Management Planned: Oral ETT  Additional Equipment:   Intra-op Plan:   Post-operative Plan: Extubation in OR  Informed Consent: I have reviewed the patients History and Physical, chart, labs and discussed the procedure including the risks, benefits and alternatives for the proposed anesthesia with the patient or authorized representative who has indicated his/her understanding and acceptance.     Plan Discussed with:   Anesthesia Plan Comments:        Anesthesia Quick Evaluation

## 2016-02-12 NOTE — Op Note (Signed)
Preoperative Diagnosis:  1.  Multiparous female desires permanent sterilization                                          2.  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  Description of Operation:  Patient was taken to the OR and placed into supine position where she underwent general anaesthesia.  She was placed in the dorsal lithotomy position and prepped and draped in the usual sterile fashion.  An incision was made in the umbilicus and dissection taken down to the rectus fascia which was incised and opened.  The non bladed trocar was then placed and the peritoneal cavity was insufflated.  The above noted findings were observed.  Additional trocars were placed in the right and left lower quadrants under direct visualization without difficulty.  The Harmonic scalpel was employed and salpingectomy of both the right and left tubes was performed.   The tubes were removed from the peritoneal cavity and sent to pathology.  There was good hemostasis bilaterally.  The fascia, peritoneum and subcutaneous tissue were closed using 0 vicryl.  The skin was closed using staples.  Exparel 266 mg 20 cc was injected in the 3 incision trocar sites. The patient was awakened from anaesthesia and taken to the PACU with all counts being correct x 3.  The patient received  1 gram of ancef andToradol 30 mg IV preoperatively.  Florian Buff 02/12/2016 12:56 PM

## 2016-02-12 NOTE — Discharge Instructions (Signed)
Laparoscopic Tubal Ligation, Care After Refer to this sheet in the next few weeks. These instructions provide you with information about caring for yourself after your procedure. Your health care provider may also give you more specific instructions. Your treatment has been planned according to current medical practices, but problems sometimes occur. Call your health care provider if you have any problems or questions after your procedure. WHAT TO EXPECT AFTER THE PROCEDURE After your procedure, it is common to have:  Sore throat.  Soreness at the incision site.  Mild cramping.  Tiredness.  Mild nausea or vomiting.  Shoulder pain. HOME CARE INSTRUCTIONS  Rest for the remainder of the day.  Take medicines only as directed by your health care provider. These include over-the-counter medicines and prescription medicines. Do not take aspirin, which can cause bleeding.  Over the next few days, gradually return to your normal activities and your normal diet.  Avoid sexual intercourse for 2 weeks or as directed by your health care provider.  Do not use tampons, and do not douche.  Do not drive or operate heavy machinery while taking pain medicine.  Do not lift anything that is heavier than 5 lb (2.3 kg) for 2 weeks or as directed by your health care provider.  Do not take baths. Take showers only. Ask your health care provider when you can start taking baths.  Take your temperature twice each day and write it down.  Try to have help for your household needs for the first 7-10 days.  There are many different ways to close and cover an incision, including stitches (sutures), skin glue, and adhesive strips. Follow instructions from your health care provider about:  Incision care.  Bandage (dressing) changes and removal.  Incision closure removal.  Check your incision area every day for signs of infection. Watch for:  Redness, swelling, or pain.  Fluid, blood, or pus.  Keep  all follow-up visits as directed by your health care provider. SEEK MEDICAL CARE IF:  You have redness, swelling, or increasing pain in your incision area.  You have fluid, blood, or pus coming from your incision for longer than 1 day.  You notice a bad smell coming from your incision or your dressing.  The edges of your incision break open after the sutures have been removed.  Your pain does not decrease after 2-3 days.  You have a rash.  You repeatedly become dizzy or light-headed.  You have a reaction to your medicine.  Your pain medicine is not helping.  You are constipated. SEEK IMMEDIATE MEDICAL CARE IF:  You have a fever.  You faint.  You have increasing pain in your abdomen.  You have severe pain in one or both of your shoulders.  You have bleeding or drainage from your suture sites or your vagina after surgery.  You have shortness of breath or have difficulty breathing.  You have chest pain or leg pain.  You have ongoing nausea, vomiting, or diarrhea.   This information is not intended to replace advice given to you by your health care provider. Make sure you discuss any questions you have with your health care provider.   Document Released: 01/23/2005 Document Revised: 11/20/2014 Document Reviewed: 10/17/2011 Elsevier Interactive Patient Education [employer].

## 2016-02-12 NOTE — Transfer of Care (Signed)
Immediate Anesthesia Transfer of Care Note  Patient: [name] [name]  Procedure(s) Performed: Procedure(s): LAPAROSCOPIC BILATERAL SALPINGECTOMY (Bilateral)  Patient Location: PACU  Anesthesia Type:General  Level of Consciousness: awake, alert , oriented and patient cooperative  Airway & Oxygen Therapy: Patient Spontanous Breathing and Patient connected to face mask oxygen  Post-op Assessment: Report given to RN and Post -op Vital signs reviewed and stable  Post vital signs: Reviewed and stable  Last Vitals:  Vitals:   02/12/16 1048  BP: (!) 105/59  Pulse: 66  Temp: 36.7 C    Last Pain:  Vitals:   02/12/16 1048  TempSrc: Oral      Patients Stated Pain Goal: 6 ([id number])  Complications: No apparent anesthesia complications

## 2016-02-12 NOTE — Anesthesia Procedure Notes (Signed)
Procedure Name: Intubation Date/Time: 02/12/2016 12:03 PM Performed by: Andree Elk, Rafiq Bucklin A Pre-anesthesia Checklist: Patient identified, Patient being monitored, Timeout performed, Emergency Drugs available and Suction available Patient Re-evaluated:Patient Re-evaluated prior to inductionOxygen Delivery Method: Circle System Utilized Preoxygenation: Pre-oxygenation with 100% oxygen Intubation Type: IV induction Ventilation: Mask ventilation without difficulty Laryngoscope Size: Miller and 3 Grade View: Grade I Tube type: Oral Tube size: 7.0 mm Number of attempts: 1 Airway Equipment and Method: Stylet Placement Confirmation: ETT inserted through vocal cords under direct vision,  positive ETCO2 and breath sounds checked- equal and bilateral Secured at: 21 cm Tube secured with: Tape Dental Injury: Teeth and Oropharynx as per pre-operative assessment

## 2016-02-12 NOTE — Anesthesia Postprocedure Evaluation (Signed)
Anesthesia Post Note  Patient: [name] [name]  Procedure(s) Performed: Procedure(s) (LRB): LAPAROSCOPIC BILATERAL SALPINGECTOMY (Bilateral)  Patient location during evaluation: PACU Anesthesia Type: General Level of consciousness: awake and oriented Pain management: pain level controlled Vital Signs Assessment: post-procedure vital signs reviewed and stable Respiratory status: spontaneous breathing Cardiovascular status: stable Postop Assessment: no signs of nausea or vomiting Anesthetic complications: no    Last Vitals:  Vitals:   02/12/16 1048 02/12/16 1307  BP: (!) 105/59   Pulse: 66   Temp: 36.7 C 36.7 C    Last Pain:  Vitals:   02/12/16 1307  TempSrc:   PainSc: 4                  Bradin Mcadory A

## 2016-02-12 NOTE — H&P (Signed)
Preoperative History and Physical  [name] [name] is a 25 y.o. [id number] with Patient's last menstrual period was 01/29/2016. admitted for a laparoscopic bilateral sterilization procedure, pt elects bilateral salpingectomy.    PMH:    Past Medical History:  Diagnosis Date  . ADHD   . Anemia   . Arthritis   . Asthma   . Early stage glaucoma   . Migraines    with aura (see flashes)  . Pregnant 09/05/2015    PSH:     Past Surgical History:  Procedure Laterality Date  . COLONOSCOPY WITH PROPOFOL N/A 12/24/2014   Procedure: COLONOSCOPY WITH PROPOFOL;  Surgeon: Rogene Houston, MD;  Location: [facility];  Service: Endoscopy;  Laterality: N/A;  total cecal time- 18 minutes  . DILATION AND CURETTAGE OF UTERUS    . JAW WIRED SHUT    . PENNY REMOVED FROM ESOPHAGUS    . POLYPECTOMY N/A 12/24/2014   Procedure: POLYPECTOMY;  Surgeon: Rogene Houston, MD;  Location: [facility];  Service: Endoscopy;  Laterality: N/A;  . tooth pulled    . WISDOM TOOTH EXTRACTION      POb/GynH:      OB History    Gravida Para Term Preterm AB Living   5 4 4   1 4    SAB TAB Ectopic Multiple Live Births   1     0 4      SH:   Social History  Substance Use Topics  . Smoking status: Current Some Day Smoker    Packs/day: 0.50    Years: 10.00    Types: Cigarettes  . Smokeless tobacco: Never Used  . Alcohol use No     Comment: every now and then; not now    FH:    Family History  Problem Relation Age of Onset  . Diabetes Mother   . Liver disease Mother     fatty liver   . Hypertension Father   . COPD Father   . Hypertension Brother   . COPD Brother   . Heart disease Maternal Grandmother   . Diabetes Maternal Grandmother   . Heart murmur Son   . Other Son     oversized tonsils     Allergies:  Allergies  Allergen Reactions  . Darvocet [Propoxyphene N-Acetaminophen] Other (See Comments)    Hallucinations - Dislocated her jaw during a vivid episode  . Imitrex [Sumatriptan Base]     Patient states  that imitrex gives her chest pain  . Adhesive [Tape] Hives  . Codeine Other (See Comments)    unknown  . Percocet [Oxycodone-Acetaminophen] Other (See Comments)    "made me feel funny"   . Latex Rash  . Sulfa Antibiotics Swelling, Rash and Other (See Comments)    Eye drops at child -- caused pain, swelling, & redness.  Her doctor said it was an allergy.    Does not remember name of drug but had, "face" sound in its name.    Medications:       Current Facility-Administered Medications:  .  bupivacaine liposome (EXPAREL) 1.3 % injection 266 mg, 20 mL, Infiltration, Once, Florian Buff, MD .  ceFAZolin (ANCEF) IVPB 2g/100 mL premix, 2 g, Intravenous, On Call to OR, Florian Buff, MD .  lactated ringers infusion, , Intravenous, Continuous, Lerry Liner, MD, Last Rate: 75 mL/hr at 02/12/16 1100 .  midazolam (VERSED) injection 1-2 mg, 1-2 mg, Intravenous, Q5 min PRN, Lerry Liner, MD, 2 mg at 02/12/16 1107  Review of  Systems:   Review of Systems  Constitutional: Negative for fever, chills, weight loss, malaise/fatigue and diaphoresis.  HENT: Negative for hearing loss, ear pain, nosebleeds, congestion, sore throat, neck pain, tinnitus and ear discharge.   Eyes: Negative for blurred vision, double vision, photophobia, pain, discharge and redness.  Respiratory: Negative for cough, hemoptysis, sputum production, shortness of breath, wheezing and stridor.   Cardiovascular: Negative for chest pain, palpitations, orthopnea, claudication, leg swelling and PND.  Gastrointestinal: Positive for abdominal pain. Negative for heartburn, nausea, vomiting, diarrhea, constipation, blood in stool and melena.  Genitourinary: Negative for dysuria, urgency, frequency, hematuria and flank pain.  Musculoskeletal: Negative for myalgias, back pain, joint pain and falls.  Skin: Negative for itching and rash.  Neurological: Negative for dizziness, tingling, tremors, sensory change, speech change, focal weakness,  seizures, loss of consciousness, weakness and headaches.  Endo/Heme/Allergies: Negative for environmental allergies and polydipsia. Does not bruise/bleed easily.  Psychiatric/Behavioral: Negative for depression, suicidal ideas, hallucinations, memory loss and substance abuse. The patient is not nervous/anxious and does not have insomnia.      PHYSICAL EXAM:  Blood pressure (!) 105/59, pulse 66, temperature 98.1 F (36.7 C), temperature source Oral, height 5' (1.524 m), weight 165 lb (74.8 kg), last menstrual period 01/29/2016, SpO2 98 %, unknown if currently breastfeeding.    Vitals reviewed. Constitutional: She is oriented to person, place, and time. She appears well-developed and well-nourished.  HENT:  Head: Normocephalic and atraumatic.  Right Ear: External ear normal.  Left Ear: External ear normal.  Nose: Nose normal.  Mouth/Throat: Oropharynx is clear and moist.  Eyes: Conjunctivae and EOM are normal. Pupils are equal, round, and reactive to light. Right eye exhibits no discharge. Left eye exhibits no discharge. No scleral icterus.  Neck: Normal range of motion. Neck supple. No tracheal deviation present. No thyromegaly present.  Cardiovascular: Normal rate, regular rhythm, normal heart sounds and intact distal pulses.  Exam reveals no gallop and no friction rub.   No murmur heard. Respiratory: Effort normal and breath sounds normal. No respiratory distress. She has no wheezes. She has no rales. She exhibits no tenderness.  GI: Soft. Bowel sounds are normal. She exhibits no distension and no mass. There is tenderness. There is no rebound and no guarding.  Genitourinary:       Vulva is normal without lesions Vagina is pink moist without discharge Cervix normal in appearance and pap is normal Uterus is normal size, contour, position, consistency, mobility, non-tender Adnexa is negative with normal sized ovaries by sonogram  Musculoskeletal: Normal range of motion. She exhibits no  edema and no tenderness.  Neurological: She is alert and oriented to person, place, and time. She has normal reflexes. She displays normal reflexes. No cranial nerve deficit. She exhibits normal muscle tone. Coordination normal.  Skin: Skin is warm and dry. No rash noted. No erythema. No pallor.  Psychiatric: She has a normal mood and affect. Her behavior is normal. Judgment and thought content normal.    Labs: Results for orders placed or performed during the hospital encounter of 02/06/16 (from the past 336 hour(s))  Urinalysis, Routine w reflex microscopic (not at [facility])   Collection Time: 02/06/16  1:00 PM  Result Value Ref Range   Color, Urine YELLOW YELLOW   APPearance CLEAR CLEAR   Specific Gravity, Urine 1.020 1.005 - 1.030   pH 5.5 5.0 - 8.0   Glucose, UA NEGATIVE NEGATIVE mg/dL   Hgb urine dipstick NEGATIVE NEGATIVE   Bilirubin Urine NEGATIVE NEGATIVE  Ketones, ur NEGATIVE NEGATIVE mg/dL   Protein, ur NEGATIVE NEGATIVE mg/dL   Nitrite NEGATIVE NEGATIVE   Leukocytes, UA NEGATIVE NEGATIVE  CBC   Collection Time: 02/06/16  1:30 PM  Result Value Ref Range   WBC 12.5 (H) 4.0 - 10.5 K/uL   RBC 4.85 3.87 - 5.11 MIL/uL   Hemoglobin 12.0 12.0 - 15.0 g/dL   HCT 37.8 36.0 - 46.0 %   MCV 77.9 (L) 78.0 - 100.0 fL   MCH 24.7 (L) 26.0 - 34.0 pg   MCHC 31.7 30.0 - 36.0 g/dL   RDW 19.0 (H) 11.5 - 15.5 %   Platelets 318 150 - 400 K/uL  Comprehensive metabolic panel   Collection Time: 02/06/16  1:30 PM  Result Value Ref Range   Sodium 137 135 - 145 mmol/L   Potassium 3.7 3.5 - 5.1 mmol/L   Chloride 109 101 - 111 mmol/L   CO2 23 22 - 32 mmol/L   Glucose, Bld 73 65 - 99 mg/dL   BUN 7 6 - 20 mg/dL   Creatinine, Ser 0.77 0.44 - 1.00 mg/dL   Calcium 8.9 8.9 - 10.3 mg/dL   Total Protein 6.3 (L) 6.5 - 8.1 g/dL   Albumin 3.9 3.5 - 5.0 g/dL   AST 18 15 - 41 U/L   ALT 19 14 - 54 U/L   Alkaline Phosphatase 55 38 - 126 U/L   Total Bilirubin 0.4 0.3 - 1.2 mg/dL   GFR calc non Af Amer >60  >60 mL/min   GFR calc Af Amer >60 >60 mL/min   Anion gap 5 5 - 15  hCG, quantitative, pregnancy   Collection Time: 02/06/16  1:30 PM  Result Value Ref Range   hCG, Beta Chain, Quant, S <1 <5 mIU/mL    EKG: Orders placed or performed in visit on 10/25/13  . EKG 12-Lead    Imaging Studies: No results found.    Assessment: Multiparous female desires permanent sterilization, chooses bilateral salpingectomy for ovarian cancer prophylaxis as well    Patient Active Problem List   Diagnosis Date Noted  . Active labor at term 12/09/2015  . ASCUS 10/02/2015  . Supervision of normal pregnancy 09/25/2015  . Pica 09/25/2015  . Pregnant 09/05/2015  . Endometritis 02/27/2014  . Scabies 12/12/2013  . 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 Prior fetal macrosomia, antepartum 09/18/2013  . ADHD   . Migraines     Plan: Laparoscopic sterilization, bilateral salpingectomy elected by patient  Keyshawn Hellwig H 02/12/2016 11:32 AM

## 2016-02-18 ENCOUNTER — Encounter (HOSPITAL_COMMUNITY): Payer: Self-pay | Admitting: Obstetrics & Gynecology

## 2016-02-20 ENCOUNTER — Ambulatory Visit (INDEPENDENT_AMBULATORY_CARE_PROVIDER_SITE_OTHER): Payer: Medicaid Other | Admitting: Obstetrics & Gynecology

## 2016-02-20 ENCOUNTER — Encounter: Payer: Self-pay | Admitting: Obstetrics & Gynecology

## 2016-02-20 VITALS — BP 100/60 | HR 80 | Ht 60.0 in | Wt 163.0 lb

## 2016-02-20 DIAGNOSIS — Z302 Encounter for sterilization: Secondary | ICD-10-CM

## 2016-02-20 DIAGNOSIS — Z9889 Other specified postprocedural states: Secondary | ICD-10-CM

## 2016-02-20 NOTE — Progress Notes (Signed)
  HPI: Patient returns for routine postoperative follow-up having undergone Laparoscopic bilateral salpingectomy  on 02/12/2016.  The patient's immediate postoperative recovery has been unremarkable. Since hospital discharge the patient reports bruising.   Current Outpatient Prescriptions: acetaminophen (TYLENOL) 325 MG tablet, Take 650 mg by mouth every 6 (six) hours as needed., Disp: , Rfl:  ibuprofen (ADVIL,MOTRIN) 600 MG tablet, Take 600 mg by mouth every 6 (six) hours as needed., Disp: , Rfl:  ketorolac (TORADOL) 10 MG tablet, Take 1 tablet (10 mg total) by mouth every 8 (eight) hours as needed., Disp: 15 tablet, Rfl: 0 ondansetron (ZOFRAN) 8 MG tablet, Take 1 tablet (8 mg total) by mouth every 8 (eight) hours as needed for nausea., Disp: 12 tablet, Rfl: 0 prenatal vitamin w/FE, FA (PRENATAL 1 + 1) 27-1 MG TABS tablet, Take 1 tablet by mouth daily at 12 noon., Disp: 30 each, Rfl: 11  No current facility-administered medications for this visit.     Blood pressure 100/60, pulse 80, height 5' (1.524 m), weight 163 lb (73.9 kg), last menstrual period 01/29/2016, unknown if currently breastfeeding.  Physical Exam: Incision x3 bruising but healing well  Diagnostic Tests: negative  Pathology: benign  Impression: S/p Laparoscopic bilateral salpingectomy  Plan:   Follow up: Prn yearly 6   months  Florian Buff, MD

## 2016-03-01 ENCOUNTER — Emergency Department (HOSPITAL_COMMUNITY)
Admission: EM | Admit: 2016-03-01 | Discharge: 2016-03-01 | Disposition: A | Discharge status: Home / Self Care | Payer: Medicaid Other | Attending: Emergency Medicine | Admitting: Emergency Medicine

## 2016-03-01 ENCOUNTER — Encounter (HOSPITAL_COMMUNITY): Payer: Self-pay | Admitting: Emergency Medicine

## 2016-03-01 DIAGNOSIS — G8918 Other acute postprocedural pain: Secondary | ICD-10-CM | POA: Diagnosis present

## 2016-03-01 DIAGNOSIS — R1031 Right lower quadrant pain: Secondary | ICD-10-CM | POA: Insufficient documentation

## 2016-03-01 DIAGNOSIS — F17210 Nicotine dependence, cigarettes, uncomplicated: Secondary | ICD-10-CM | POA: Insufficient documentation

## 2016-03-01 DIAGNOSIS — J45909 Unspecified asthma, uncomplicated: Secondary | ICD-10-CM | POA: Insufficient documentation

## 2016-03-01 MED ORDER — IBUPROFEN 800 MG PO TABS
800.0000 mg | ORAL_TABLET | Freq: Once | ORAL | Status: AC
  Administered 2016-03-01: 800 mg via ORAL
  Filled 2016-03-01: qty 1

## 2016-03-01 NOTE — ED Provider Notes (Signed)
[place] DEPT Provider Note   CSN: [id number] Arrival date & time: 03/01/16  1714  First Provider Contact:  None       History   Chief Complaint Chief Complaint  Patient presents with  . Post-op Problem    HPI [name] [name] is a 25 y.o. female.  Status post bilateral laparoscopic salpingectomy on 02/12/16 by Dr. Elonda Husky.  Patient now complains of a knot in her right lower quadrant. No fever, sweats, chills. She is eating and ambulatory. No vomiting. No vaginal bleeding or discharge. She has taken nothing for her pain at home      Past Medical History:  Diagnosis Date  . ADHD   . Anemia   . Arthritis   . Asthma   . Early stage glaucoma   . Migraines    with aura (see flashes)  . Pregnant 09/05/2015    Patient Active Problem List   Diagnosis Date Noted  . Active labor at term 12/09/2015  . ASCUS 10/02/2015  . Supervision of normal pregnancy 09/25/2015  . Pica 09/25/2015  . Pregnant 09/05/2015  . Endometritis 02/27/2014  . Scabies 12/12/2013  . 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 Prior fetal macrosomia, antepartum 09/18/2013  . ADHD   . Migraines     Past Surgical History:  Procedure Laterality Date  . COLONOSCOPY WITH PROPOFOL N/A 12/24/2014   Procedure: COLONOSCOPY WITH PROPOFOL;  Surgeon: Rogene Houston, MD;  Location: [facility];  Service: Endoscopy;  Laterality: N/A;  total cecal time- 18 minutes  . DILATION AND CURETTAGE OF UTERUS    . JAW WIRED SHUT    . LAPAROSCOPIC BILATERAL SALPINGECTOMY Bilateral 02/12/2016   Procedure: LAPAROSCOPIC BILATERAL SALPINGECTOMY;  Surgeon: Florian Buff, MD;  Location: [facility];  Service: Gynecology;  Laterality: Bilateral;  . PENNY REMOVED FROM ESOPHAGUS    . POLYPECTOMY N/A 12/24/2014   Procedure: POLYPECTOMY;  Surgeon: Rogene Houston, MD;  Location: [facility];  Service: Endoscopy;  Laterality: N/A;  . tooth pulled    . WISDOM TOOTH EXTRACTION      OB History    Gravida Para Term Preterm AB Living   5 4 4   1 4    SAB TAB  Ectopic Multiple Live Births   1     0 4       Home Medications    Prior to Admission medications   Medication Sig Start Date End Date Taking? Authorizing Provider  acetaminophen (TYLENOL) 325 MG tablet Take 650 mg by mouth every 6 (six) hours as needed.    Historical Provider, MD  ibuprofen (ADVIL,MOTRIN) 600 MG tablet Take 600 mg by mouth every 6 (six) hours as needed.    Historical Provider, MD  ketorolac (TORADOL) 10 MG tablet Take 1 tablet (10 mg total) by mouth every 8 (eight) hours as needed. 02/12/16   Florian Buff, MD  ondansetron (ZOFRAN) 8 MG tablet Take 1 tablet (8 mg total) by mouth every 8 (eight) hours as needed for nausea. 02/12/16   Florian Buff, MD  prenatal vitamin w/FE, FA (PRENATAL 1 + 1) 27-1 MG TABS tablet Take 1 tablet by mouth daily at 12 noon. 09/05/15   Estill Dooms, NP    Family History Family History  Problem Relation Age of Onset  . Diabetes Mother   . Liver disease Mother     fatty liver   . Hypertension Father   . COPD Father   . Hypertension Brother   . COPD Brother   .  Heart disease Maternal Grandmother   . Diabetes Maternal Grandmother   . Heart murmur Son   . Other Son     oversized tonsils    Social History Social History  Substance Use Topics  . Smoking status: Current Some Day Smoker    Packs/day: 0.50    Years: 10.00    Types: Cigarettes  . Smokeless tobacco: Never Used  . Alcohol use No     Comment: every now and then; not now     Allergies   Darvocet [propoxyphene n-acetaminophen]; Imitrex [sumatriptan base]; Adhesive [tape]; Codeine; Percocet [oxycodone-acetaminophen]; Latex; and Sulfa antibiotics   Review of Systems Review of Systems  All other systems reviewed and are negative.    Physical Exam Updated Vital Signs BP (!) 90/48 (BP Location: Left Arm)   Pulse 61   Temp 98.2 F (36.8 C) (Oral)   Resp 16   Ht 5' (1.524 m)   Wt 163 lb (73.9 kg)   LMP 02/23/2016   SpO2 99%   BMI 31.83 kg/m   Physical  Exam  Constitutional: She appears well-developed and well-nourished. No distress.  HENT:  Head: Normocephalic and atraumatic.  Eyes: Conjunctivae are normal.  Neck: Neck supple.  Cardiovascular: Normal rate and regular rhythm.   No murmur heard. Pulmonary/Chest: Effort normal and breath sounds normal. No respiratory distress.  Abdominal: Soft. There is no tenderness.  No acute abdomen. There is a 1.5 x 1.5 cm palpable mass in the right lower quadrant.  It does not appear to be an abscess.  Musculoskeletal: She exhibits no edema.  Neurological: She is alert.  Skin: Skin is warm and dry.  Psychiatric: She has a normal mood and affect.  Nursing note and vitals reviewed.    ED Treatments / Results  Labs (all labs ordered are listed, but only abnormal results are displayed) Labs Reviewed - No data to display  EKG  EKG Interpretation None       Radiology No results found.  Procedures Procedures (including critical care time)  Medications Ordered in ED Medications  ibuprofen (ADVIL,MOTRIN) tablet 800 mg (800 mg Oral Given 03/01/16 1934)     Initial Impression / Assessment and Plan / ED Course  I have reviewed the triage vital signs and the nursing notes.  Pertinent labs & imaging results that were available during my care of the patient were reviewed by me and considered in my medical decision making (see chart for details).  Clinical Course    No acute abdomen. Vital signs normal. Will try over-the-counter pain medicine. Follow-up with GYN.  Final Clinical Impressions(s) / ED Diagnoses   Final diagnoses:  Postoperative pain    New Prescriptions New Prescriptions   No medications on file     Nat Christen, MD 03/01/16 1953

## 2016-03-01 NOTE — Discharge Instructions (Signed)
Tylenol or ibuprofen for pain. Follow-up with Dr. Elonda Husky this week if symptoms persist.

## 2016-03-01 NOTE — ED Triage Notes (Signed)
Patient had bilateral laparoscopic salpingectomy 2 weeks ago, done by Dr Elonda Husky. Per patient feels like a "knot under" right lower abd incision. Per patient lower abd still hurts and is very tender. Per patient also has had nausea but denies any vomiting. Denies any fevers or drainage from site. Patient told to come to ER by after hours nurse line at [facility].

## 2016-08-24 ENCOUNTER — Other Ambulatory Visit: Payer: Medicaid Other | Admitting: Obstetrics & Gynecology

## 2016-10-27 ENCOUNTER — Other Ambulatory Visit: Payer: Self-pay | Admitting: Obstetrics & Gynecology

## 2016-10-27 ENCOUNTER — Encounter (INDEPENDENT_AMBULATORY_CARE_PROVIDER_SITE_OTHER): Payer: Self-pay

## 2018-11-01 ENCOUNTER — Emergency Department (HOSPITAL_COMMUNITY): Payer: Medicaid - Out of State

## 2018-11-01 ENCOUNTER — Other Ambulatory Visit: Payer: Self-pay

## 2018-11-01 ENCOUNTER — Emergency Department (HOSPITAL_COMMUNITY)
Admission: EM | Admit: 2018-11-01 | Discharge: 2018-11-01 | Disposition: A | Discharge status: Home / Self Care | Payer: Medicaid - Out of State | Attending: Emergency Medicine | Admitting: Emergency Medicine

## 2018-11-01 ENCOUNTER — Encounter (HOSPITAL_COMMUNITY): Payer: Self-pay | Admitting: Emergency Medicine

## 2018-11-01 DIAGNOSIS — X500XXA Overexertion from strenuous movement or load, initial encounter: Secondary | ICD-10-CM | POA: Diagnosis not present

## 2018-11-01 DIAGNOSIS — S93402A Sprain of unspecified ligament of left ankle, initial encounter: Secondary | ICD-10-CM | POA: Diagnosis not present

## 2018-11-01 DIAGNOSIS — S93402D Sprain of unspecified ligament of left ankle, subsequent encounter: Secondary | ICD-10-CM

## 2018-11-01 DIAGNOSIS — Y998 Other external cause status: Secondary | ICD-10-CM | POA: Diagnosis not present

## 2018-11-01 DIAGNOSIS — S99912A Unspecified injury of left ankle, initial encounter: Secondary | ICD-10-CM | POA: Diagnosis present

## 2018-11-01 DIAGNOSIS — Y9289 Other specified places as the place of occurrence of the external cause: Secondary | ICD-10-CM | POA: Diagnosis not present

## 2018-11-01 DIAGNOSIS — Y9389 Activity, other specified: Secondary | ICD-10-CM | POA: Insufficient documentation

## 2018-11-01 DIAGNOSIS — F17210 Nicotine dependence, cigarettes, uncomplicated: Secondary | ICD-10-CM | POA: Diagnosis not present

## 2018-11-01 NOTE — ED Triage Notes (Signed)
Pt states she twisted her Left ankle a week ago and it just hasn't got any better.

## 2018-11-01 NOTE — ED Provider Notes (Signed)
[facility] EMERGENCY DEPARTMENT Provider Note   CSN: [id number] Arrival date & time: 11/01/18  2052    History   Chief Complaint Chief Complaint  Patient presents with  . Ankle Pain    left    HPI [name] [name] is a 28 y.o. female.     HPI   She complains of pain and left ankle, for about a week after spraining it.  She is already been evaluated, had imaging, and told to use an Ace wrap, for sprain.  She is concerned that the pain is still present, now radiates to her left lower leg.  No other trauma.  The initial injury was twisting her ankle, while walking.  She denies fever, chills, bowel/bladder dysfunction, low back pain, dizziness or weakness.  There are no other known modifying factors.  Past Medical History:  Diagnosis Date  . ADHD   . Anemia   . Arthritis   . Asthma   . Early stage glaucoma   . Migraines    with aura (see flashes)  . Pregnant 09/05/2015    Patient Active Problem List   Diagnosis Date Noted  . Active labor at term 12/09/2015  . ASCUS 10/02/2015  . Supervision of normal pregnancy 09/25/2015  . Pica 09/25/2015  . Pregnant 09/05/2015  . Endometritis 02/27/2014  . Scabies 12/12/2013  . 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 Prior fetal macrosomia, antepartum 09/18/2013  . ADHD   . Migraines     Past Surgical History:  Procedure Laterality Date  . COLONOSCOPY WITH PROPOFOL N/A 12/24/2014   Procedure: COLONOSCOPY WITH PROPOFOL;  Surgeon: Rogene Houston, MD;  Location: [facility];  Service: Endoscopy;  Laterality: N/A;  total cecal time- 18 minutes  . DILATION AND CURETTAGE OF UTERUS    . JAW WIRED SHUT    . LAPAROSCOPIC BILATERAL SALPINGECTOMY Bilateral 02/12/2016   Procedure: LAPAROSCOPIC BILATERAL SALPINGECTOMY;  Surgeon: Florian Buff, MD;  Location: [facility];  Service: Gynecology;  Laterality: Bilateral;  . PENNY REMOVED FROM ESOPHAGUS    . POLYPECTOMY N/A 12/24/2014   Procedure: POLYPECTOMY;  Surgeon: Rogene Houston, MD;  Location: [facility];   Service: Endoscopy;  Laterality: N/A;  . tooth pulled    . WISDOM TOOTH EXTRACTION       OB History    Gravida  5   Para  4   Term  4   Preterm      AB  1   Living  4     SAB  1   TAB      Ectopic      Multiple  0   Live Births  4            Home Medications    Prior to Admission medications   Medication Sig Start Date End Date Taking? Authorizing Provider  acetaminophen (TYLENOL) 325 MG tablet Take 650 mg by mouth every 6 (six) hours as needed.    [provider]  ibuprofen (ADVIL,MOTRIN) 600 MG tablet Take 600 mg by mouth every 6 (six) hours as needed.    [provider]  ketorolac (TORADOL) 10 MG tablet Take 1 tablet (10 mg total) by mouth every 8 (eight) hours as needed. 02/12/16   Florian Buff, MD  ondansetron (ZOFRAN) 8 MG tablet Take 1 tablet (8 mg total) by mouth every 8 (eight) hours as needed for nausea. 02/12/16   Florian Buff, MD  prenatal vitamin w/FE, FA (PRENATAL 1 + 1) 27-1 MG TABS  tablet Take 1 tablet by mouth daily at 12 noon. 09/05/15   Estill Dooms, NP    Family History Family History  Problem Relation Age of Onset  . Diabetes Mother   . Liver disease Mother        fatty liver   . Hypertension Father   . COPD Father   . Hypertension Brother   . COPD Brother   . Heart disease Maternal Grandmother   . Diabetes Maternal Grandmother   . Heart murmur Son   . Other Son        oversized tonsils    Social History Social History   Tobacco Use  . Smoking status: Current Some Day Smoker    Packs/day: 0.50    Years: 10.00    Pack years: 5.00    Types: Cigarettes  . Smokeless tobacco: Never Used  Substance Use Topics  . Alcohol use: No    Comment: every now and then; not now  . Drug use: No     Allergies   Darvocet [propoxyphene n-acetaminophen]; Imitrex [sumatriptan base]; Adhesive [tape]; Codeine; Percocet [oxycodone-acetaminophen]; Latex; and Sulfa antibiotics   Review of Systems Review of Systems   All other systems reviewed and are negative.    Physical Exam Updated Vital Signs BP 126/65 (BP Location: Right Arm)   Pulse 80   Temp 98.1 F (36.7 C) (Oral)   Resp 16   Ht 5\' 1"  (1.549 m)   Wt 77.6 kg   LMP 10/25/2018   SpO2 100%   BMI 32.31 kg/m   Physical Exam Vitals signs and nursing note reviewed.  Constitutional:      Appearance: She is well-developed.  HENT:     Head: Normocephalic and atraumatic.     Right Ear: External ear normal.     Left Ear: External ear normal.  Eyes:     Conjunctiva/sclera: Conjunctivae normal.     Pupils: Pupils are equal, round, and reactive to light.  Neck:     Musculoskeletal: Normal range of motion and neck supple.     Trachea: Phonation normal.  Cardiovascular:     Rate and Rhythm: Normal rate.  Pulmonary:     Effort: Pulmonary effort is normal.  Musculoskeletal:     Comments: She guards against movement of the left ankle secondary to pain.  There is mild tenderness medial ankle, on the distal tibia, and on the lateral hindfoot.  There is no gross instability or deformity.  Neurovascular intact distally.  Achilles tendon is intact.  There is mild tenderness of the left calf without swelling or deformity.  There is no popliteal mass or tenderness.  Normal range of motion left knee.  Neurovascular intact distally in the left foot.  Skin:    General: Skin is warm and dry.  Neurological:     Mental Status: She is alert and oriented to person, place, and time.     Cranial Nerves: No cranial nerve deficit.     Sensory: No sensory deficit.     Motor: No abnormal muscle tone.     Coordination: Coordination normal.  Psychiatric:        Behavior: Behavior normal.        Thought Content: Thought content normal.        Judgment: Judgment normal.      ED Treatments / Results  Labs (all labs ordered are listed, but only abnormal results are displayed) Labs Reviewed - No data to display  EKG None  Radiology Dg Ankle  Complete Left   Result Date: 11/01/2018 CLINICAL DATA:  Twisting injury 1 week ago with persistent ankle pain, initial encounter EXAM: LEFT ANKLE COMPLETE - 3+ VIEW COMPARISON:  01/27/2011 FINDINGS: Mild soft tissue swelling is noted about the ankle. No acute fracture or dislocation is noted. IMPRESSION: No acute bony abnormality is noted. Mild soft tissue swelling is noted. Electronically Signed   By: Inez Catalina M.D.   On: 11/01/2018 21:40    Procedures Procedures (including critical care time)  Medications Ordered in ED Medications - No data to display   Initial Impression / Assessment and Plan / ED Course  I have reviewed the triage vital signs and the nursing notes.  Pertinent labs & imaging results that were available during my care of the patient were reviewed by me and considered in my medical decision making (see chart for details).  Clinical Course as of Oct 31 2208  Tue Nov 01, 2018  2146 No fracture or dislocation; images reviewed by me  DG Ankle Complete Left [EW]    Clinical Course User Index [EW] Daleen Bo, MD        Patient Vitals for the past 24 hrs:  BP Temp Temp src Pulse Resp SpO2 Height Weight  11/01/18 2059 - - - - - - 5\' 1"  (1.549 m) 77.6 kg  11/01/18 2057 126/65 98.1 F (36.7 C) Oral 80 16 100 % - -    10:10 PM Reevaluation with update and discussion. After initial assessment and treatment, an updated evaluation reveals no change in clinical status.  Findings discussed with the patient and all questions were answered. Daleen Bo   Medical Decision Making: Evaluation consistent with subacute ankle injury, about a week old.  No gross instability.  No evident fracture on imaging.  Doubt significant tendinopathy.  CRITICAL CARE-no Performed by: Daleen Bo  Nursing Notes Reviewed/ Care Coordinated Applicable Imaging Reviewed Interpretation of Laboratory Data incorporated into ED treatment  The patient appears reasonably screened and/or stabilized for  discharge and I doubt any other medical condition or other [facility] requiring further screening, evaluation, or treatment in the ED at this time prior to discharge.  Plan: Home Medications-OTC analgesia, ibuprofen; Home Treatments-ankle splint to use at home; return here if the recommended treatment, does not improve the symptoms; Recommended follow up-orthopedics follow-up 1 week and as needed.   Final Clinical Impressions(s) / ED Diagnoses   Final diagnoses:  Sprain of left ankle, unspecified ligament, subsequent encounter    ED Discharge Orders    None       Daleen Bo, MD 11/01/18 2211

## 2018-11-01 NOTE — Discharge Instructions (Signed)
Use the ankle splint whenever you get up to walk.  Use heat on the sore area of your ankle 3 or 4 times a day for 30 minutes.  For pain take ibuprofen 400 mg 3 times a day with meals.  Call the orthopedic doctor for follow-up appointment for further evaluation and treatment of the left ankle sprain.

## 2019-02-16 ENCOUNTER — Encounter (HOSPITAL_COMMUNITY): Payer: Self-pay | Admitting: Emergency Medicine

## 2019-02-16 ENCOUNTER — Other Ambulatory Visit: Payer: Self-pay

## 2019-02-16 ENCOUNTER — Emergency Department (HOSPITAL_COMMUNITY)
Admission: EM | Admit: 2019-02-16 | Discharge: 2019-02-16 | Disposition: A | Discharge status: Home / Self Care | Payer: Self-pay | Attending: Emergency Medicine | Admitting: Emergency Medicine

## 2019-02-16 DIAGNOSIS — K047 Periapical abscess without sinus: Secondary | ICD-10-CM | POA: Insufficient documentation

## 2019-02-16 DIAGNOSIS — K029 Dental caries, unspecified: Secondary | ICD-10-CM | POA: Insufficient documentation

## 2019-02-16 DIAGNOSIS — J45909 Unspecified asthma, uncomplicated: Secondary | ICD-10-CM | POA: Insufficient documentation

## 2019-02-16 DIAGNOSIS — Z79899 Other long term (current) drug therapy: Secondary | ICD-10-CM | POA: Insufficient documentation

## 2019-02-16 DIAGNOSIS — F17210 Nicotine dependence, cigarettes, uncomplicated: Secondary | ICD-10-CM | POA: Insufficient documentation

## 2019-02-16 MED ORDER — AMOXICILLIN 250 MG PO CAPS
500.0000 mg | ORAL_CAPSULE | Freq: Once | ORAL | Status: AC
  Administered 2019-02-16: 500 mg via ORAL
  Filled 2019-02-16: qty 2

## 2019-02-16 MED ORDER — IBUPROFEN 400 MG PO TABS
400.0000 mg | ORAL_TABLET | Freq: Once | ORAL | Status: AC
  Administered 2019-02-16: 400 mg via ORAL
  Filled 2019-02-16: qty 1

## 2019-02-16 MED ORDER — TRAMADOL HCL 50 MG PO TABS
50.0000 mg | ORAL_TABLET | Freq: Once | ORAL | Status: AC
  Administered 2019-02-16: 50 mg via ORAL
  Filled 2019-02-16: qty 1

## 2019-02-16 MED ORDER — TRAMADOL HCL 50 MG PO TABS: 50.0000 mg | ORAL_TABLET | Freq: Four times a day (QID) | ORAL | 0 refills | Status: AC | PRN

## 2019-02-16 MED ORDER — AMOXICILLIN 500 MG PO CAPS: 500.0000 mg | ORAL_CAPSULE | Freq: Three times a day (TID) | ORAL | 0 refills | Status: AC

## 2019-02-16 NOTE — ED Provider Notes (Signed)
[facility] EMERGENCY DEPARTMENT Provider Note   CSN: [id number] Arrival date & time: 02/16/19  4098     History   Chief Complaint Chief Complaint  Patient presents with  . Dental Pain    HPI [name] [name] is a 28 y.o. female.     Patient c/o left upper and right lower dental pain in past 1-2 days. Symptoms acute onset, moderate-sev, constant, persistent, non radiating. Denies injury to areas. No fever or chills. No sore throat or trouble swallowing. No nausea/vomiting.   The history is provided by the patient.  Dental Pain Associated symptoms: no fever, no headaches and no neck pain     Past Medical History:  Diagnosis Date  . ADHD   . Anemia   . Arthritis   . Asthma   . Early stage glaucoma   . Migraines    with aura (see flashes)  . Pregnant 09/05/2015    Patient Active Problem List   Diagnosis Date Noted  . Active labor at term 12/09/2015  . ASCUS 10/02/2015  . Supervision of normal pregnancy 09/25/2015  . Pica 09/25/2015  . Pregnant 09/05/2015  . Endometritis 02/27/2014  . Scabies 12/12/2013  . 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 Prior fetal macrosomia, antepartum 09/18/2013  . ADHD   . Migraines     Past Surgical History:  Procedure Laterality Date  . COLONOSCOPY WITH PROPOFOL N/A 12/24/2014   Procedure: COLONOSCOPY WITH PROPOFOL;  Surgeon: Rogene Houston, MD;  Location: [facility];  Service: Endoscopy;  Laterality: N/A;  total cecal time- 18 minutes  . DILATION AND CURETTAGE OF UTERUS    . JAW WIRED SHUT    . LAPAROSCOPIC BILATERAL SALPINGECTOMY Bilateral 02/12/2016   Procedure: LAPAROSCOPIC BILATERAL SALPINGECTOMY;  Surgeon: Florian Buff, MD;  Location: [facility];  Service: Gynecology;  Laterality: Bilateral;  . PENNY REMOVED FROM ESOPHAGUS    . POLYPECTOMY N/A 12/24/2014   Procedure: POLYPECTOMY;  Surgeon: Rogene Houston, MD;  Location: [facility];  Service: Endoscopy;  Laterality: N/A;  . tooth pulled    . WISDOM TOOTH EXTRACTION       OB History    Gravida  5   Para  4   Term  4   Preterm      AB  1   Living  4     SAB  1   TAB      Ectopic      Multiple  0   Live Births  4            Home Medications    Prior to Admission medications   Medication Sig Start Date End Date Taking? Authorizing Provider  acetaminophen (TYLENOL) 325 MG tablet Take 650 mg by mouth every 6 (six) hours as needed.    [provider]  ibuprofen (ADVIL,MOTRIN) 600 MG tablet Take 600 mg by mouth every 6 (six) hours as needed.    [provider]  ketorolac (TORADOL) 10 MG tablet Take 1 tablet (10 mg total) by mouth every 8 (eight) hours as needed. 02/12/16   Florian Buff, MD  ondansetron (ZOFRAN) 8 MG tablet Take 1 tablet (8 mg total) by mouth every 8 (eight) hours as needed for nausea. 02/12/16   Florian Buff, MD  prenatal vitamin w/FE, FA (PRENATAL 1 + 1) 27-1 MG TABS tablet Take 1 tablet by mouth daily at 12 noon. 09/05/15   Estill Dooms, NP    Family History Family History  Problem Relation Age  of Onset  . Diabetes Mother   . Liver disease Mother        fatty liver   . Hypertension Father   . COPD Father   . Hypertension Brother   . COPD Brother   . Heart disease Maternal Grandmother   . Diabetes Maternal Grandmother   . Heart murmur Son   . Other Son        oversized tonsils    Social History Social History   Tobacco Use  . Smoking status: Current Some Day Smoker    Packs/day: 0.50    Years: 10.00    Pack years: 5.00    Types: Cigarettes  . Smokeless tobacco: Never Used  Substance Use Topics  . Alcohol use: No    Comment: every now and then; not now  . Drug use: No     Allergies   Darvocet [propoxyphene n-acetaminophen], Imitrex [sumatriptan base], Adhesive [tape], Clindamycin/lincomycin, Codeine, Percocet [oxycodone-acetaminophen], Latex, and Sulfa antibiotics   Review of Systems Review of Systems  Constitutional: Negative for fever.  HENT: Positive for dental problem. Negative  for sore throat and trouble swallowing.   Gastrointestinal: Negative for vomiting.  Musculoskeletal: Negative for neck pain.  Skin: Negative for rash.  Neurological: Negative for headaches.     Physical Exam Updated Vital Signs BP 116/61 (BP Location: Right Arm)   Pulse 78   Temp 97.9 F (36.6 C) (Temporal)   Resp 16   Ht 1.549 m (5\' 1" )   Wt 81.6 kg   LMP 01/22/2019 (Approximate)   SpO2 100%   BMI 34.01 kg/m   Physical Exam Vitals signs and nursing note reviewed.  Constitutional:      Appearance: Normal appearance. She is well-developed.  HENT:     Head: Atraumatic.     Nose: Nose normal.     Mouth/Throat:     Mouth: Mucous membranes are moist.     Pharynx: Oropharynx is clear.     Comments: Several absent teeth. Right lower tooth with decay, associated gum swelling. Left upper molar w decay, gum tenderness. No trismus. Pharynx normal. No gross facial swelling. No swelling or tenderness to floor of mouth or neck.  Eyes:     General: No scleral icterus.    Conjunctiva/sclera: Conjunctivae normal.  Neck:     Musculoskeletal: Normal range of motion and neck supple. No neck rigidity or muscular tenderness.     Trachea: No tracheal deviation.  Cardiovascular:     Rate and Rhythm: Normal rate.     Pulses: Normal pulses.  Pulmonary:     Effort: Pulmonary effort is normal. No respiratory distress.     Breath sounds: No stridor.  Genitourinary:    Comments: No cva tenderness.  Musculoskeletal:        General: No swelling.  Lymphadenopathy:     Cervical: No cervical adenopathy.  Skin:    General: Skin is warm and dry.     Findings: No rash.  Neurological:     Mental Status: She is alert.     Comments: Alert, speech normal/fluent. Steady gait.   Psychiatric:        Mood and Affect: Mood normal.      ED Treatments / Results  Labs (all labs ordered are listed, but only abnormal results are displayed) Labs Reviewed - No data to display  EKG None  Radiology  No results found.  Procedures Procedures (including critical care time)  Medications Ordered in ED Medications  amoxicillin (AMOXIL) capsule 500 mg (  has no administration in time range)  traMADol (ULTRAM) tablet 50 mg (has no administration in time range)  ibuprofen (ADVIL) tablet 400 mg (has no administration in time range)     Initial Impression / Assessment and Plan / ED Course  I have reviewed the triage vital signs and the nursing notes.  Pertinent labs & imaging results that were available during my care of the patient were reviewed by me and considered in my medical decision making (see chart for details).  Pt indicates has ride, did not drive here. No meds pta.   Confirmed only abx allergy is clinda.   Amoxicillin po. Ibuprofen po. Ultram po.  Rec close outpt dental f/u.  Return precautions provided.     Final Clinical Impressions(s) / ED Diagnoses   Final diagnoses:  None    ED Discharge Orders    None       Lajean Saver, MD 02/16/19 1148

## 2019-02-16 NOTE — Discharge Instructions (Signed)
It was our pleasure to provide your ER care today - we hope that you feel better.  Take ibuprofen as need for pain. You may also take ultram as need for pain - no driving for the next 6 hours, or when taking ultram.  Take amoxicillin as prescribed.   Follow up with dentist in the coming week - call office to arrange appointment.   Return to ER if worse, new symptoms, fevers, increased swelling, or other concern.

## 2019-02-16 NOTE — ED Triage Notes (Signed)
Pt c/o LT lower dental pain that began yesterday. Pt states her face is swollen and her chin feels bruised. Pt does not see a dentist because she does not have insurance.

## 2019-11-02 ENCOUNTER — Other Ambulatory Visit: Payer: Self-pay

## 2019-11-02 ENCOUNTER — Emergency Department (HOSPITAL_COMMUNITY): Payer: Medicaid - Out of State

## 2019-11-02 ENCOUNTER — Encounter (HOSPITAL_COMMUNITY): Payer: Self-pay | Admitting: Emergency Medicine

## 2019-11-02 ENCOUNTER — Emergency Department (HOSPITAL_COMMUNITY)
Admission: EM | Admit: 2019-11-02 | Discharge: 2019-11-02 | Disposition: A | Discharge status: Home / Self Care | Payer: Medicaid - Out of State | Attending: Emergency Medicine | Admitting: Emergency Medicine

## 2019-11-02 DIAGNOSIS — S0003XA Contusion of scalp, initial encounter: Secondary | ICD-10-CM | POA: Insufficient documentation

## 2019-11-02 DIAGNOSIS — F17210 Nicotine dependence, cigarettes, uncomplicated: Secondary | ICD-10-CM | POA: Insufficient documentation

## 2019-11-02 DIAGNOSIS — R519 Headache, unspecified: Secondary | ICD-10-CM | POA: Diagnosis not present

## 2019-11-02 DIAGNOSIS — Y9389 Activity, other specified: Secondary | ICD-10-CM | POA: Insufficient documentation

## 2019-11-02 DIAGNOSIS — Z91040 Latex allergy status: Secondary | ICD-10-CM | POA: Diagnosis not present

## 2019-11-02 DIAGNOSIS — J45909 Unspecified asthma, uncomplicated: Secondary | ICD-10-CM | POA: Diagnosis not present

## 2019-11-02 DIAGNOSIS — S0990XA Unspecified injury of head, initial encounter: Secondary | ICD-10-CM | POA: Diagnosis present

## 2019-11-02 DIAGNOSIS — Y999 Unspecified external cause status: Secondary | ICD-10-CM | POA: Insufficient documentation

## 2019-11-02 DIAGNOSIS — Y92410 Unspecified street and highway as the place of occurrence of the external cause: Secondary | ICD-10-CM | POA: Diagnosis not present

## 2019-11-02 DIAGNOSIS — Z79899 Other long term (current) drug therapy: Secondary | ICD-10-CM | POA: Diagnosis not present

## 2019-11-02 MED ORDER — METOCLOPRAMIDE HCL 5 MG/ML IJ SOLN
10.0000 mg | Freq: Once | INTRAMUSCULAR | Status: AC
  Administered 2019-11-02: 10 mg via INTRAMUSCULAR
  Filled 2019-11-02: qty 2

## 2019-11-02 MED ORDER — DIPHENHYDRAMINE HCL 50 MG/ML IJ SOLN
25.0000 mg | Freq: Once | INTRAMUSCULAR | Status: AC
  Administered 2019-11-02: 25 mg via INTRAMUSCULAR
  Filled 2019-11-02: qty 1
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
## 2019-11-02 NOTE — Discharge Instructions (Addendum)
Use ice packs where your scalp is sore.  You can safely take acetaminophen 1000 mg every 6 hours for headache as needed.  Have someone monitor you the next 24 to 48 hours and you should be rechecked if you have any problems listed on the head injury sheet.

## 2019-12-05 ENCOUNTER — Encounter (INDEPENDENT_AMBULATORY_CARE_PROVIDER_SITE_OTHER): Payer: Self-pay | Admitting: *Deleted

## 2020-08-06 ENCOUNTER — Emergency Department (HOSPITAL_COMMUNITY): Payer: Medicaid - Out of State

## 2020-08-06 ENCOUNTER — Other Ambulatory Visit: Payer: Self-pay

## 2020-08-06 ENCOUNTER — Emergency Department (HOSPITAL_COMMUNITY)
Admission: EM | Admit: 2020-08-06 | Discharge: 2020-08-07 | Disposition: A | Discharge status: Home / Self Care | Payer: Medicaid - Out of State | Source: Home / Self Care

## 2020-08-06 ENCOUNTER — Emergency Department (HOSPITAL_COMMUNITY)
Admission: EM | Admit: 2020-08-06 | Discharge: 2020-08-06 | Disposition: A | Discharge status: Home / Self Care | Payer: Medicaid - Out of State | Attending: Emergency Medicine | Admitting: Emergency Medicine

## 2020-08-06 ENCOUNTER — Encounter (HOSPITAL_COMMUNITY): Payer: Self-pay | Admitting: *Deleted

## 2020-08-06 DIAGNOSIS — J45909 Unspecified asthma, uncomplicated: Secondary | ICD-10-CM | POA: Diagnosis not present

## 2020-08-06 DIAGNOSIS — R519 Headache, unspecified: Secondary | ICD-10-CM | POA: Insufficient documentation

## 2020-08-06 DIAGNOSIS — Y92481 Parking lot as the place of occurrence of the external cause: Secondary | ICD-10-CM | POA: Insufficient documentation

## 2020-08-06 DIAGNOSIS — F17210 Nicotine dependence, cigarettes, uncomplicated: Secondary | ICD-10-CM | POA: Diagnosis not present

## 2020-08-06 DIAGNOSIS — S0990XA Unspecified injury of head, initial encounter: Secondary | ICD-10-CM | POA: Insufficient documentation

## 2020-08-06 DIAGNOSIS — Z79899 Other long term (current) drug therapy: Secondary | ICD-10-CM | POA: Insufficient documentation

## 2020-08-06 DIAGNOSIS — M542 Cervicalgia: Secondary | ICD-10-CM | POA: Insufficient documentation

## 2020-08-06 DIAGNOSIS — W19XXXA Unspecified fall, initial encounter: Secondary | ICD-10-CM

## 2020-08-06 DIAGNOSIS — M25561 Pain in right knee: Secondary | ICD-10-CM | POA: Diagnosis not present

## 2020-08-06 DIAGNOSIS — W000XXA Fall on same level due to ice and snow, initial encounter: Secondary | ICD-10-CM | POA: Diagnosis not present

## 2020-08-06 DIAGNOSIS — M545 Low back pain, unspecified: Secondary | ICD-10-CM | POA: Diagnosis not present

## 2020-08-06 DIAGNOSIS — Z5321 Procedure and treatment not carried out due to patient leaving prior to being seen by health care provider: Secondary | ICD-10-CM | POA: Insufficient documentation

## 2020-08-06 DIAGNOSIS — Y92410 Unspecified street and highway as the place of occurrence of the external cause: Secondary | ICD-10-CM | POA: Insufficient documentation

## 2020-08-06 DIAGNOSIS — M25511 Pain in right shoulder: Secondary | ICD-10-CM | POA: Insufficient documentation

## 2020-08-06 LAB — POC URINE PREG, ED: Preg Test, Ur: NEGATIVE

## 2020-08-06 MED ORDER — IBUPROFEN 600 MG PO TABS: 600.0000 mg | ORAL_TABLET | Freq: Four times a day (QID) | ORAL | 0 refills | Status: AC | PRN

## 2020-08-06 MED ORDER — KETOROLAC TROMETHAMINE 30 MG/ML IJ SOLN
30.0000 mg | Freq: Once | INTRAMUSCULAR | Status: AC
  Administered 2020-08-06: 30 mg via INTRAMUSCULAR
  Filled 2020-08-06: qty 1

## 2020-08-06 MED ORDER — METHOCARBAMOL 500 MG PO TABS
500.0000 mg | ORAL_TABLET | Freq: Once | ORAL | Status: AC
  Administered 2020-08-06: 500 mg via ORAL
  Filled 2020-08-06: qty 1

## 2020-08-06 MED ORDER — METHOCARBAMOL 500 MG PO TABS: 500.0000 mg | ORAL_TABLET | Freq: Two times a day (BID) | ORAL | 0 refills | Status: AC

## 2020-08-06 NOTE — Discharge Instructions (Signed)
Your imaging today has all been reassuring.  I expect that you will be sore for the next few days, you can use ibuprofen 600 mg every 6 hours, you can also use Tylenol in addition to this.  You can take Robaxin twice a day to help with muscle spasms.  You can apply ice and heat to the area.  Follow-up with your PCP if symptoms are not improving.
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
## 2022-01-17 IMAGING — CT CT CERVICAL SPINE W/O CM
3 of 4 series · 11 of 33 positions shown, 13 images · non-contrast
Comparison: CT head 11/02/2019

CLINICAL DATA: Fall on ice.  Head neck pain

EXAM:
CT HEAD WITHOUT CONTRAST
CT CERVICAL SPINE WITHOUT CONTRAST
TECHNIQUE: Multidetector CT imaging of the head and cervical spine was
performed following the standard protocol without intravenous
contrast. Multiplanar CT image reconstructions of the cervical spine
were also generated.

[Series 5: sagittal bone · sagittal · 0.21mm/px · 5 of 61 slices shown, 6 images]
[im 21/61  bone]
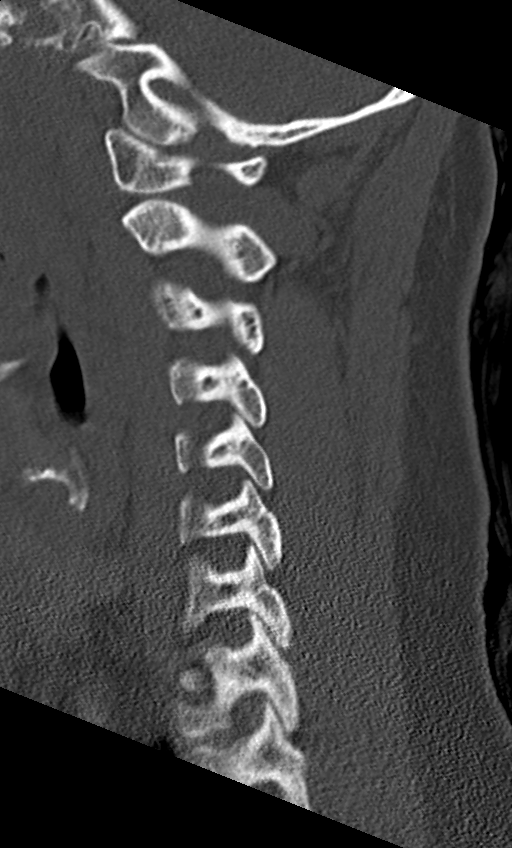
[im 26/61  bone]
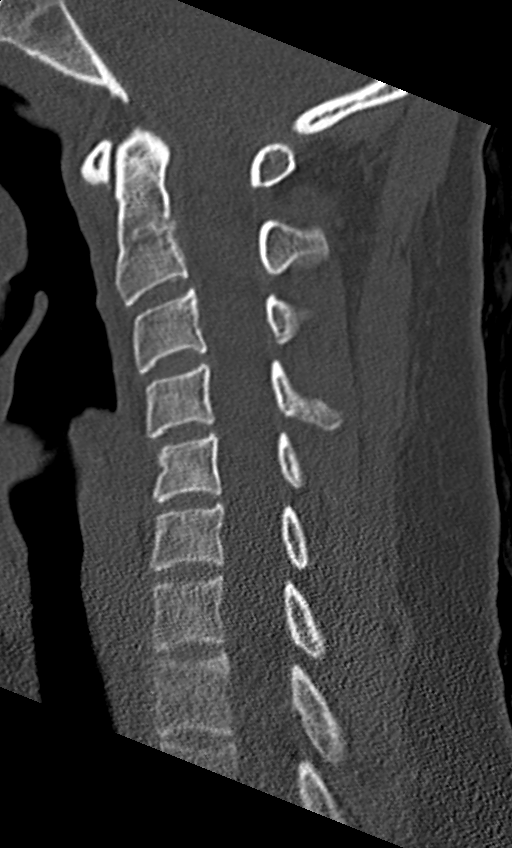
[im 31/61  soft-tissue]
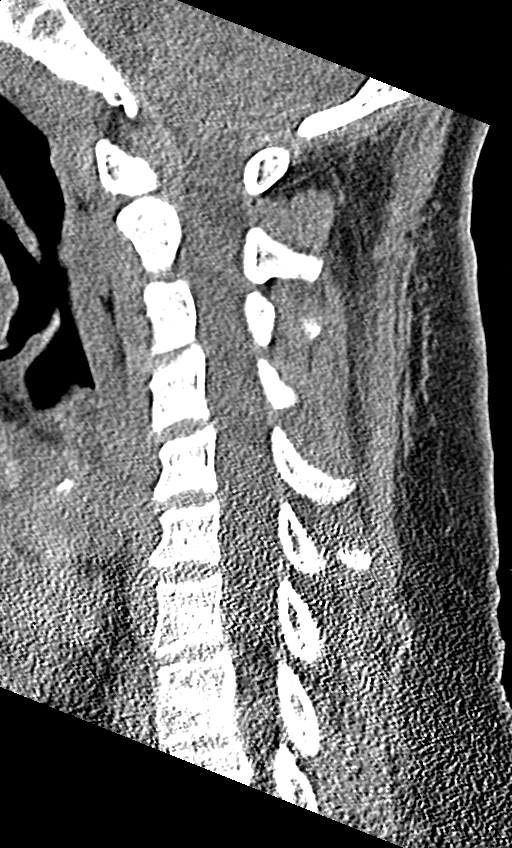
[im 31/61  bone]
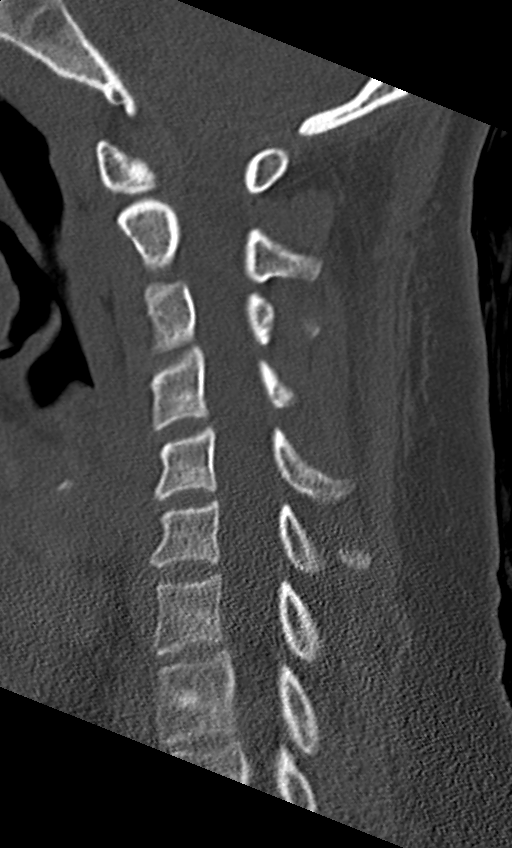
[im 36/61  bone]
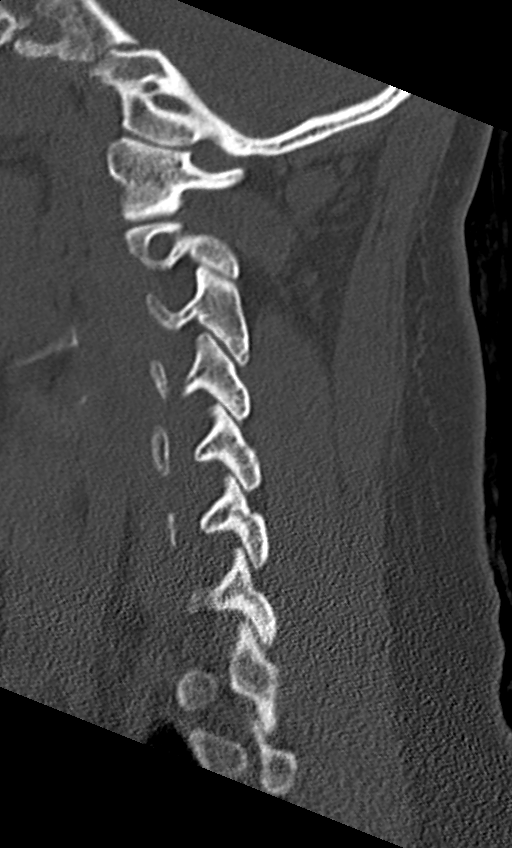
[im 41/61  bone]
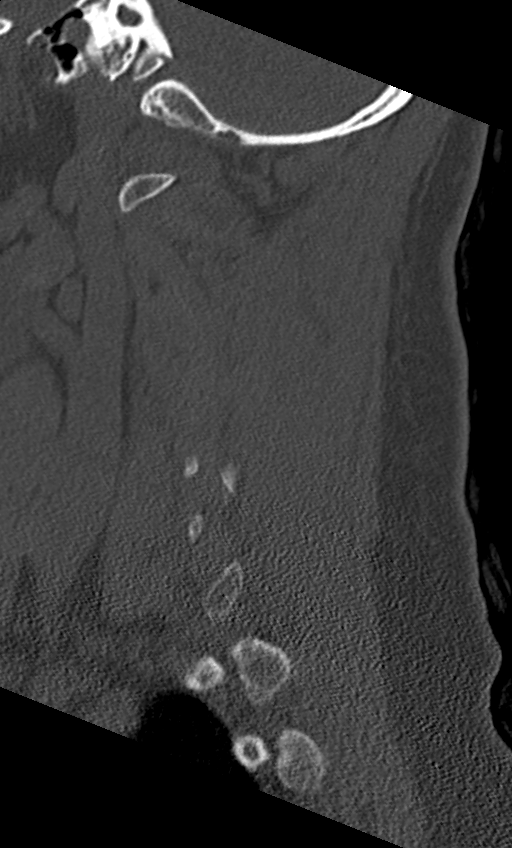

[Series 6: coronal bone · coronal · 0.20mm/px · 3 of 61 slices shown]
[im 13/61  bone]
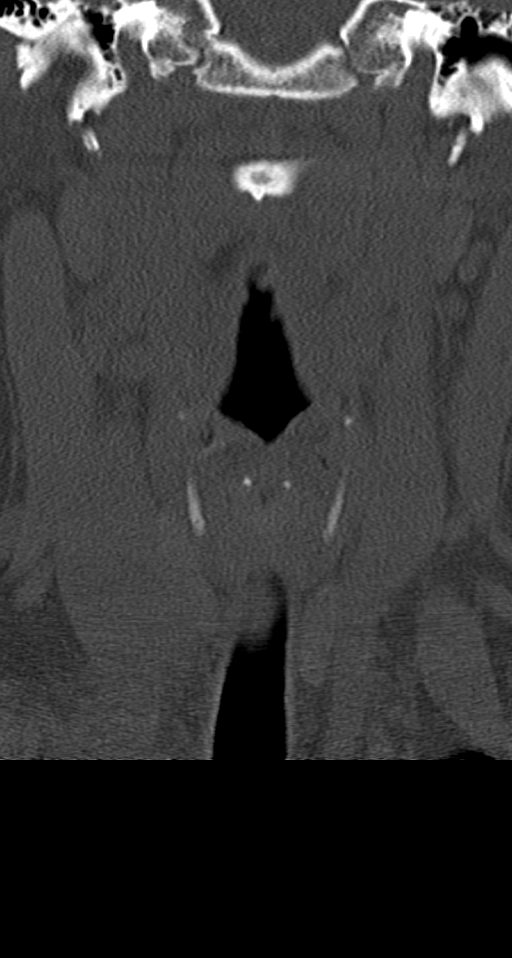
[im 25/61  bone]
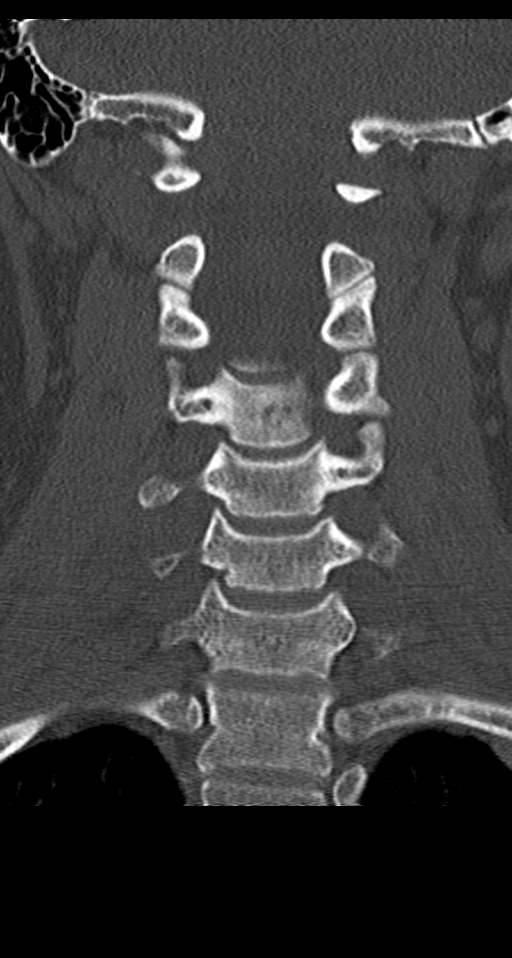
[im 37/61  bone]
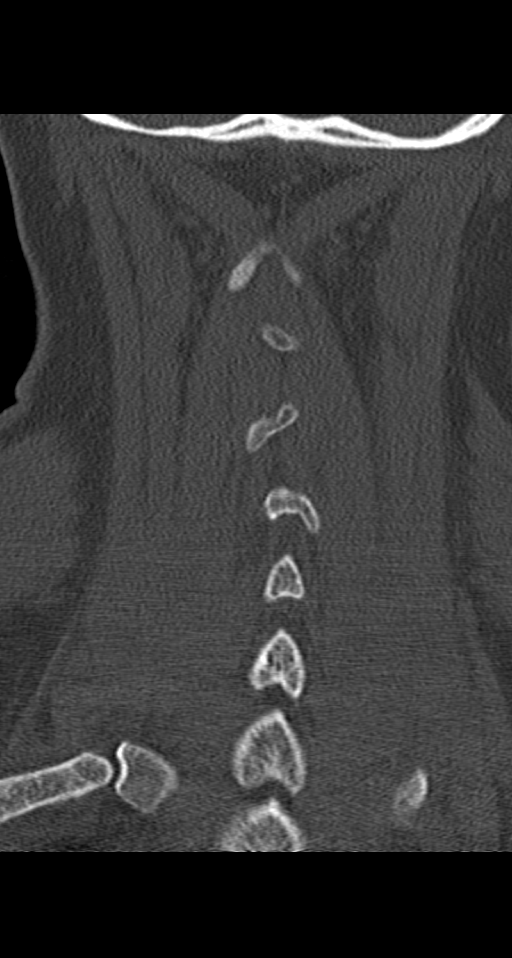

[Series 7: orthogonal axials · axial · 0.21mm/px · z∈[-98,-1]mm · 3 of 111 slices shown, 4 images]
[im 32/111  soft-tissue]
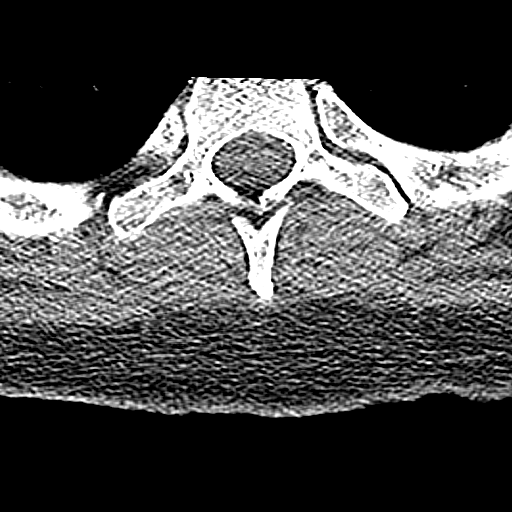
[im 32/111  bone]
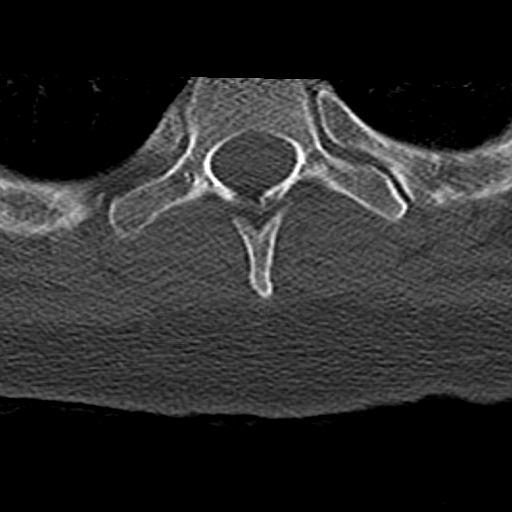
[im 63/111  bone]
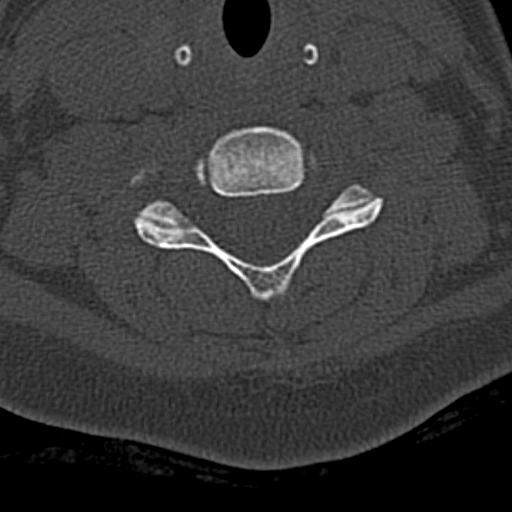
[im 95/111  bone]
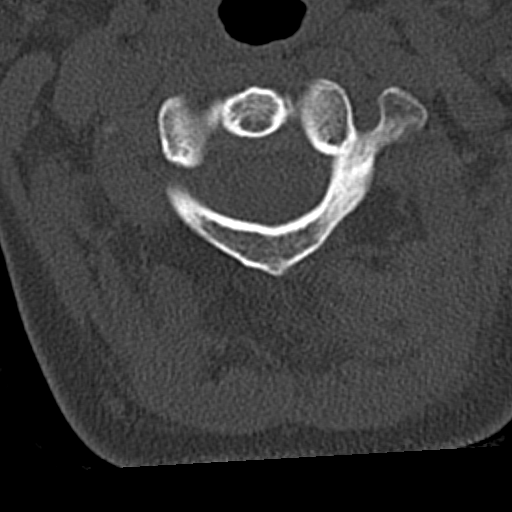

[11 of 33 positions shown; findings below may reference images not displayed]

FINDINGS: CT HEAD FINDINGS

Brain: No evidence of acute infarction, hemorrhage, hydrocephalus,
extra-axial collection or mass lesion/mass effect.

Vascular: Negative for hyperdense vessel

Skull: Negative for fracture.

Sinuses/Orbits: Mild mucosal edema paranasal sinuses. Negative orbit

Other: None

CT CERVICAL SPINE FINDINGS

Alignment: Normal

Skull base and vertebrae: Negative for fracture

Soft tissues and spinal canal: Negative

Disc levels: Normal disc spaces. No significant degenerative change

Upper chest: Negative

Other: None
IMPRESSION: Negative CT head

Negative CT cervical spine

## 2022-01-17 IMAGING — DX DG LUMBAR SPINE COMPLETE 4+V
5 series · 5 of 5 positions shown · non-contrast
Comparison: None.

CLINICAL DATA: Fall on ice today

EXAM:
LUMBAR SPINE - COMPLETE 4+ VIEW

[l-spine ap]
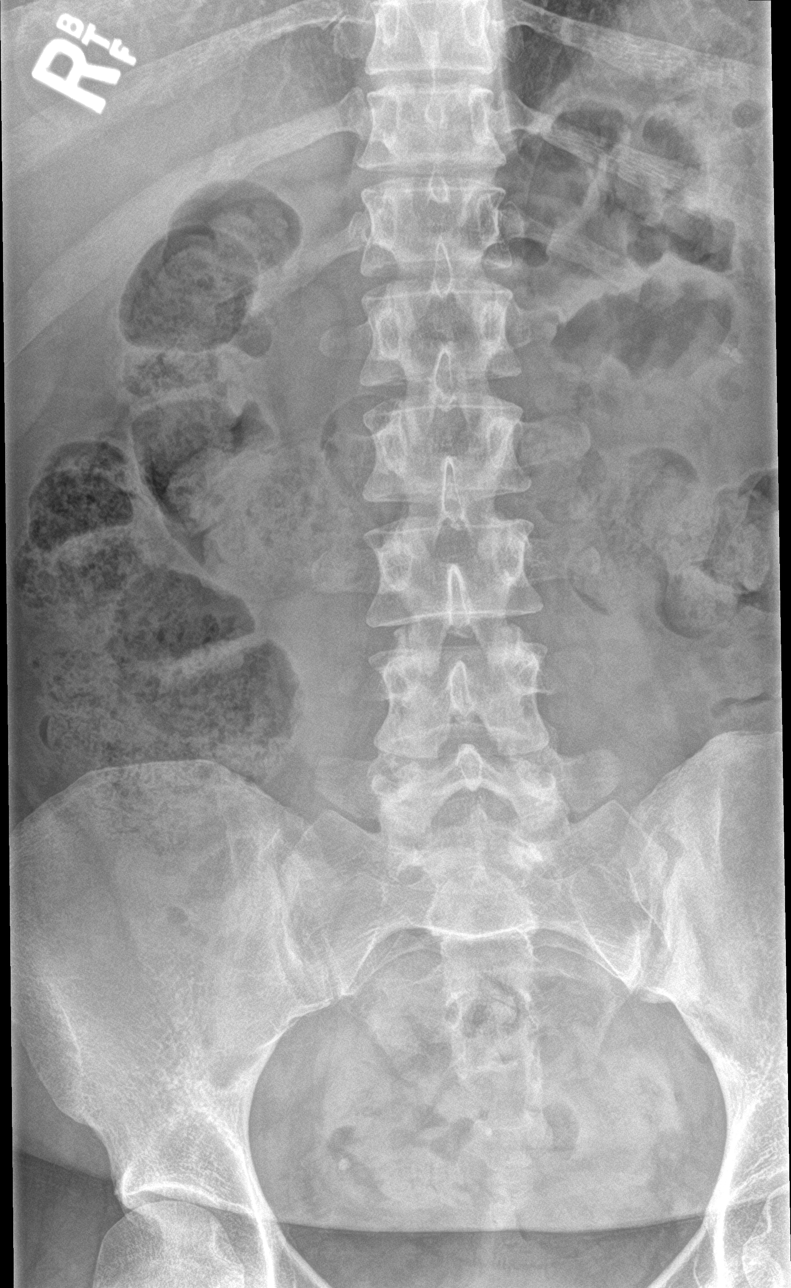

[l-spine obl (1 of 2)]
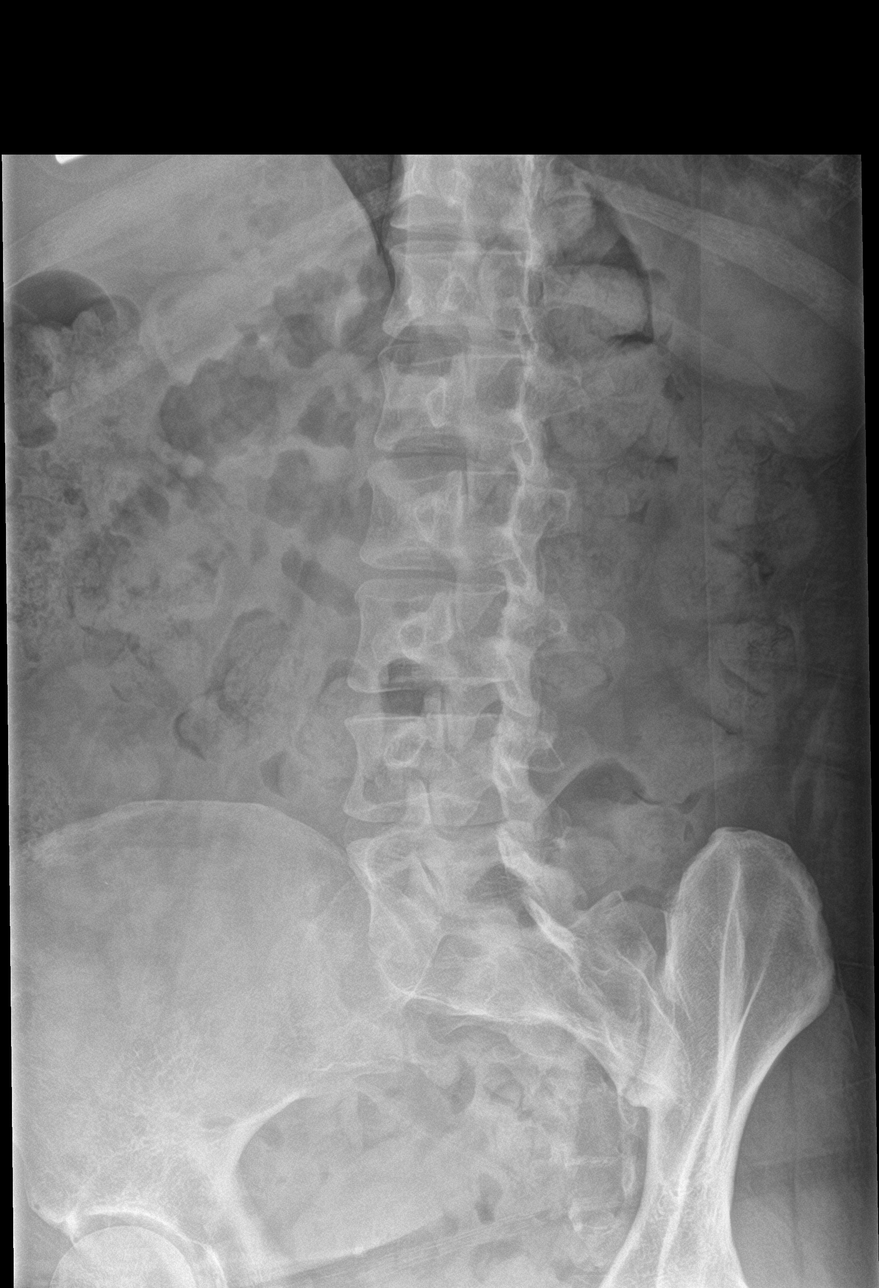

[l-spine obl (2 of 2)]
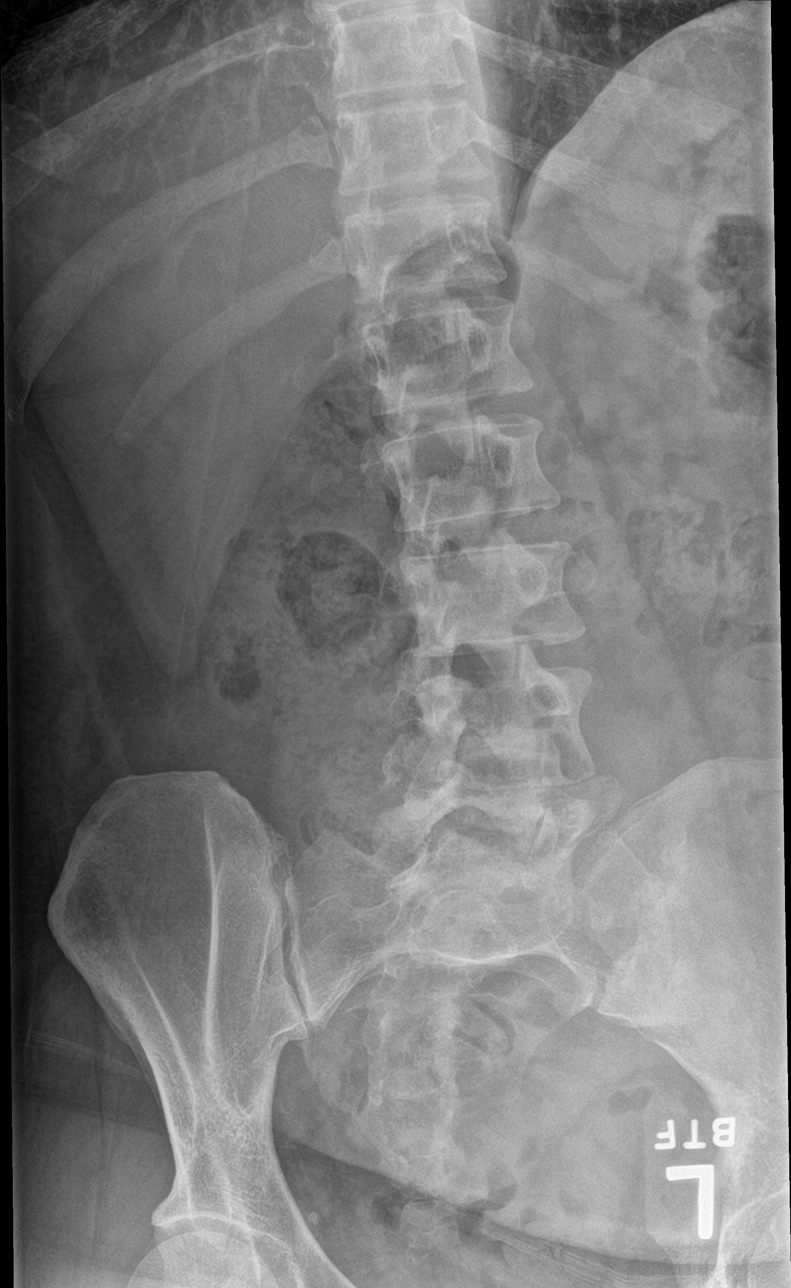

[l-spine lat]
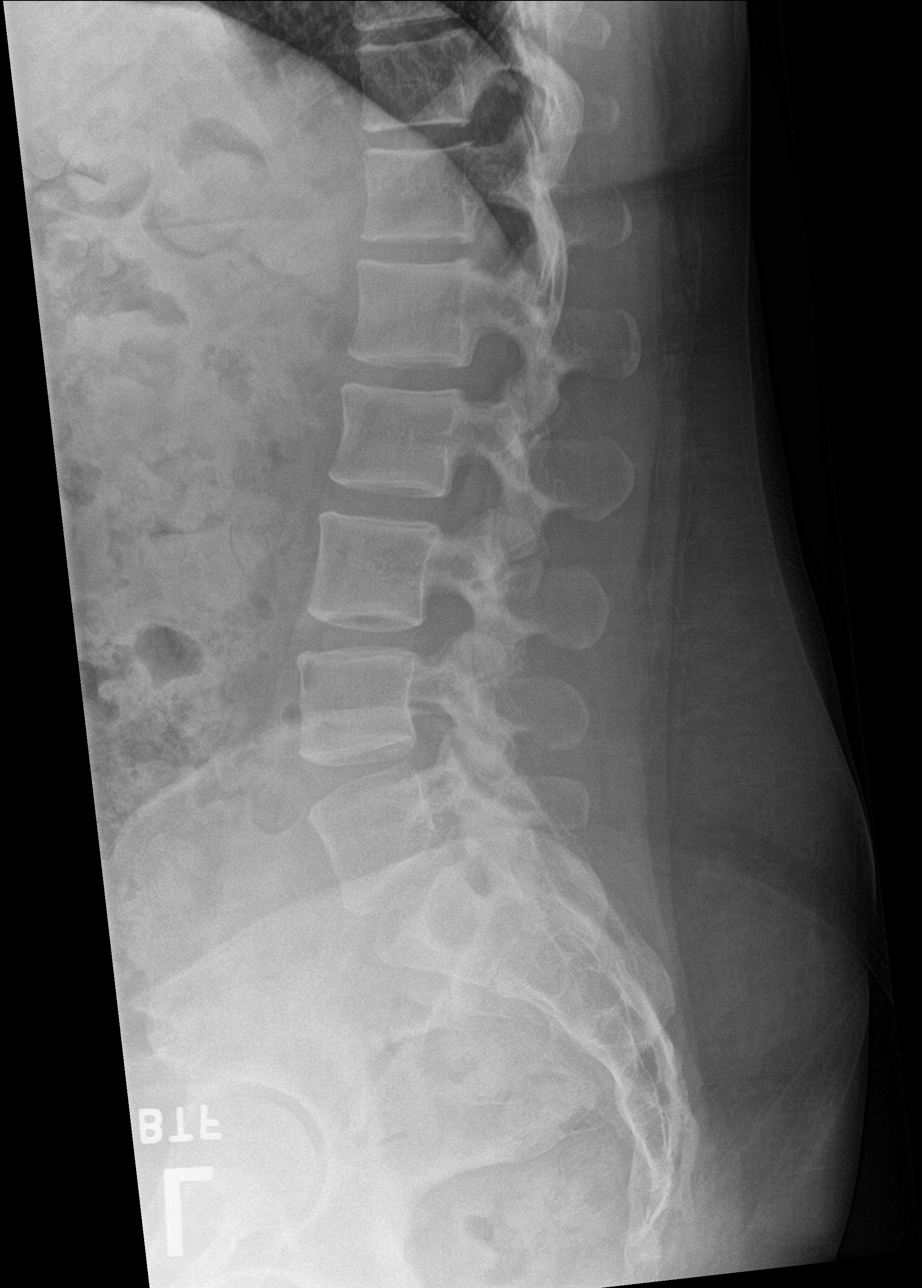

[l-spine spot]
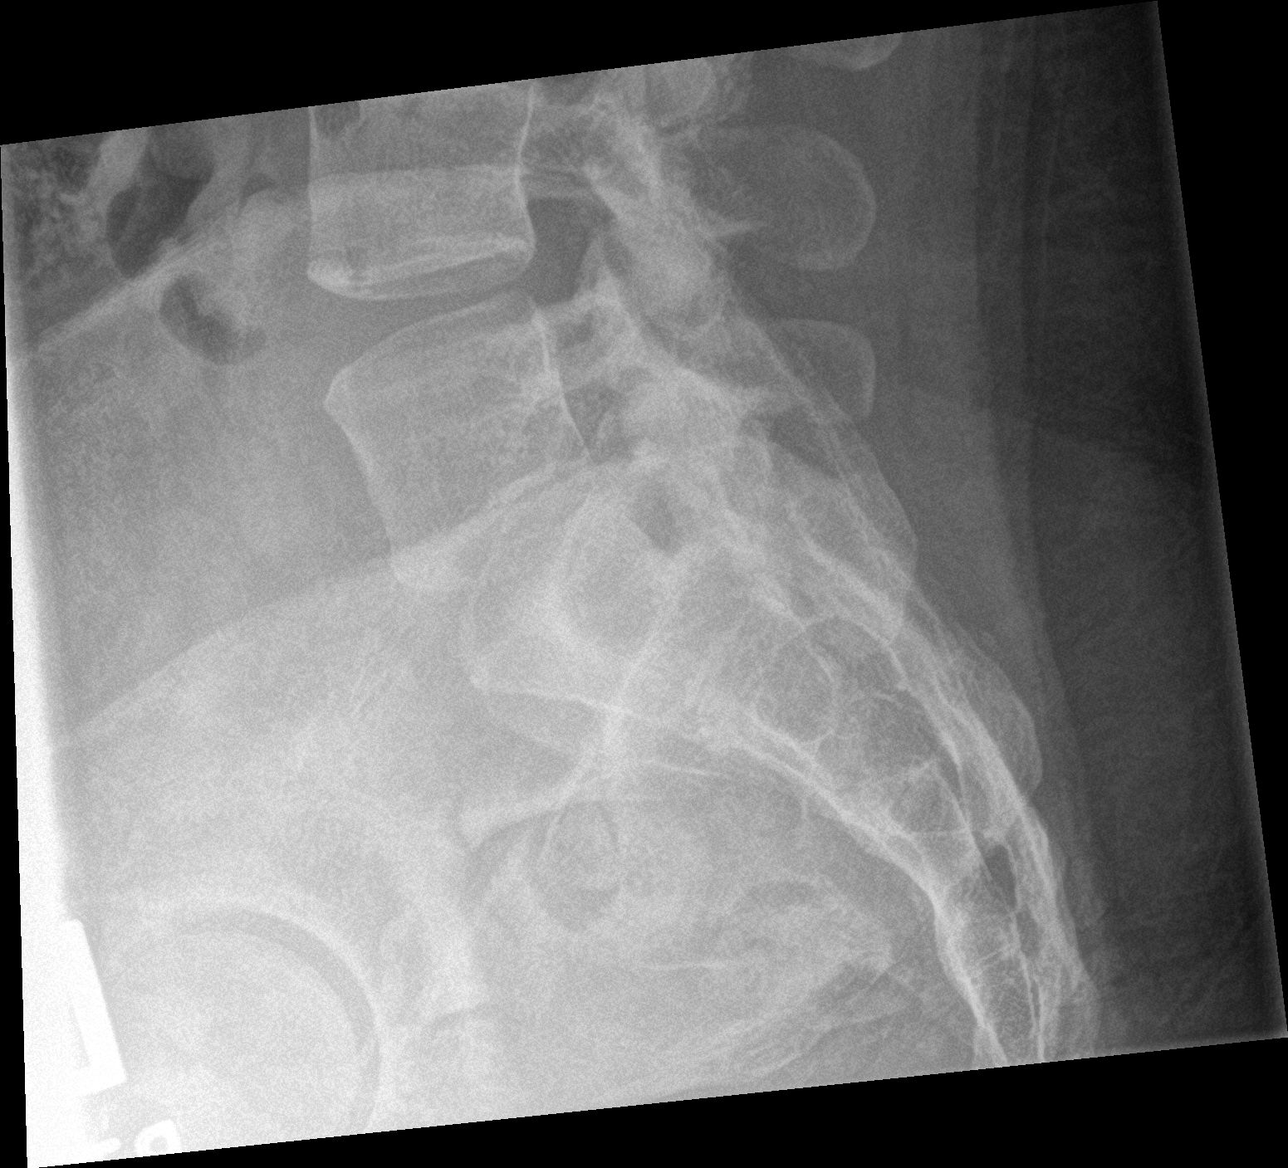

[5 of 5 positions shown; findings below may reference images not displayed]

FINDINGS: There is no evidence of lumbar spine fracture. Alignment is normal.
Intervertebral disc spaces are maintained.
IMPRESSION: Negative.

## 2022-05-29 ENCOUNTER — Other Ambulatory Visit: Payer: Self-pay

## 2022-05-29 ENCOUNTER — Emergency Department (HOSPITAL_COMMUNITY)
Admission: EM | Admit: 2022-05-29 | Discharge: 2022-05-29 | Discharge status: Against Medical Advice | Payer: Medicaid - Out of State | Source: Home / Self Care

## 2022-05-29 ENCOUNTER — Encounter (HOSPITAL_COMMUNITY): Payer: Self-pay | Admitting: Emergency Medicine

## 2022-05-29 ENCOUNTER — Emergency Department (HOSPITAL_COMMUNITY)
Admission: EM | Admit: 2022-05-29 | Discharge: 2022-05-29 | Disposition: A | Discharge status: Home / Self Care | Payer: Medicaid - Out of State | Attending: Emergency Medicine | Admitting: Emergency Medicine

## 2022-05-29 DIAGNOSIS — Z91040 Latex allergy status: Secondary | ICD-10-CM | POA: Insufficient documentation

## 2022-05-29 DIAGNOSIS — K0889 Other specified disorders of teeth and supporting structures: Secondary | ICD-10-CM | POA: Diagnosis present

## 2022-05-29 DIAGNOSIS — K029 Dental caries, unspecified: Secondary | ICD-10-CM | POA: Insufficient documentation

## 2022-05-29 DIAGNOSIS — K0381 Cracked tooth: Secondary | ICD-10-CM | POA: Diagnosis not present

## 2022-05-29 MED ORDER — NAPROXEN 375 MG PO TABS: 375.0000 mg | ORAL_TABLET | Freq: Two times a day (BID) | ORAL | 0 refills | Status: AC

## 2022-05-29 MED ORDER — PENICILLIN V POTASSIUM 500 MG PO TABS: 500.0000 mg | ORAL_TABLET | Freq: Four times a day (QID) | ORAL | 0 refills | Status: AC

## 2022-05-29 NOTE — ED Provider Notes (Signed)
[facility] EMERGENCY DEPARTMENT Provider Note   CSN: [id number] Arrival date & time: 05/29/22  0054     History  Chief Complaint  Patient presents with   Dental Pain    [name] [name] is a 31 y.o. female.  The history is provided by the patient and medical records.  Dental Pain  31 y.o. F here with left sided dental pain.  Ongoing for a few days, had a partial crown but it has come off and now has exposed broken tooth.  Pain radiating into the left ear as well.  Denies fever/chills.  No trouble swallowing.   Unable to get in with dentist currently.  Home Medications Prior to Admission medications   Medication Sig Start Date End Date Taking? Authorizing Provider  naproxen (NAPROSYN) 375 MG tablet Take 1 tablet (375 mg total) by mouth 2 (two) times daily. 05/29/22  Yes Larene Pickett, PA-C  penicillin v potassium (VEETID) 500 MG tablet Take 1 tablet (500 mg total) by mouth 4 (four) times daily for 10 days. 05/29/22 06/08/22 Yes Larene Pickett, PA-C  acetaminophen (TYLENOL) 325 MG tablet Take 650 mg by mouth every 6 (six) hours as needed.    [provider]  amoxicillin (AMOXIL) 500 MG capsule Take 1 capsule (500 mg total) by mouth 3 (three) times daily. 02/16/19   Lajean Saver, MD  ibuprofen (ADVIL) 600 MG tablet Take 1 tablet (600 mg total) by mouth every 6 (six) hours as needed. 08/06/20   Jacqlyn Larsen, PA-C  ketorolac (TORADOL) 10 MG tablet Take 1 tablet (10 mg total) by mouth every 8 (eight) hours as needed. 02/12/16   Florian Buff, MD  methocarbamol (ROBAXIN) 500 MG tablet Take 1 tablet (500 mg total) by mouth 2 (two) times daily. 08/06/20   Jacqlyn Larsen, PA-C  ondansetron (ZOFRAN) 8 MG tablet Take 1 tablet (8 mg total) by mouth every 8 (eight) hours as needed for nausea. 02/12/16   Florian Buff, MD  prenatal vitamin w/FE, FA (PRENATAL 1 + 1) 27-1 MG TABS tablet Take 1 tablet by mouth daily at 12 noon. 09/05/15   Estill Dooms, NP   traMADol (ULTRAM) 50 MG tablet Take 1 tablet (50 mg total) by mouth every 6 (six) hours as needed. 02/16/19   Lajean Saver, MD      Allergies    Darvocet [propoxyphene n-acetaminophen], Imitrex [sumatriptan base], Adhesive [tape], Clindamycin/lincomycin, Codeine, Percocet [oxycodone-acetaminophen], Tramadol, Latex, and Sulfa antibiotics    Review of Systems   Review of Systems  HENT:  Positive for dental problem.   All other systems reviewed and are negative.   Physical Exam Updated Vital Signs BP 130/80   Pulse 81   Temp 98 F (36.7 C)   Resp 17   SpO2 100%   Physical Exam Vitals and nursing note reviewed.  Constitutional:      Appearance: She is well-developed.  HENT:     Head: Normocephalic and atraumatic.     Comments: Teeth largely in poor dentition, multiple teeth missing, left upper and lower 1st molars are broken and decayed, surrounding gingiva appears inflamed without discrete abscess or fluid collection, handling secretions appropriately, no trismus, no facial or neck swelling, normal phonation without stridor Eyes:     Conjunctiva/sclera: Conjunctivae normal.     Pupils: Pupils are equal, round, and reactive to light.  Cardiovascular:     Rate and Rhythm: Normal rate and regular rhythm.     Heart sounds: Normal  heart sounds.  Pulmonary:     Effort: Pulmonary effort is normal.     Breath sounds: Normal breath sounds.  Abdominal:     General: Bowel sounds are normal.     Palpations: Abdomen is soft.  Musculoskeletal:        General: Normal range of motion.     Cervical back: Normal range of motion.  Skin:    General: Skin is warm and dry.  Neurological:     Mental Status: She is alert and oriented to person, place, and time.     ED Results / Procedures / Treatments   Labs (all labs ordered are listed, but only abnormal results are displayed) Labs Reviewed - No data to display  EKG None  Radiology No results found.  Procedures Procedures     Medications Ordered in ED Medications - No data to display  ED Course/ Medical Decision Making/ A&P                           Medical Decision Making Risk Prescription drug management.   31 y.o. F here with left sided dental pain.  Has several broken/decayed teeth but no discrete abscess or fluid collection. No signs/symptoms concerning for ludwig's angina.  Will start on abx and refer to dentist for follow-up.  Return here for new concerns.  Final Clinical Impression(s) / ED Diagnoses Final diagnoses:  Pain, dental    Rx / DC Orders ED Discharge Orders          Ordered    penicillin v potassium (VEETID) 500 MG tablet  4 times daily        05/29/22 0113    naproxen (NAPROSYN) 375 MG tablet  2 times daily        05/29/22 0113              Larene Pickett, PA-C 56/38/75 [id number]    Delora Fuel, MD 29/51/88 [phone]

## 2022-05-29 NOTE — ED Triage Notes (Signed)
Patient here with dental pain.  She is having pain on the bottom left jaw.  She states that it is making her ears hurt.

## 2022-05-29 NOTE — Discharge Instructions (Signed)
Take the prescribed medication as directed. Follow-up with dentist-- Dr. Haig Prophet on call, can try his office in the morning or one of the dental clinics listed on back. Return to the ED for new or worsening symptoms.
